# Patient Record
Sex: Male | Born: 1970 | Race: White | Hispanic: No | Marital: Married | Smoking: Never smoker
Health system: Southern US, Community
[De-identification: ages and names within clinical notes are randomized; demographics above are authoritative.]

## PROBLEM LIST (undated history)

## (undated) DIAGNOSIS — Z87442 Personal history of urinary calculi: Secondary | ICD-10-CM

## (undated) DIAGNOSIS — K219 Gastro-esophageal reflux disease without esophagitis: Secondary | ICD-10-CM

## (undated) DIAGNOSIS — F32A Depression, unspecified: Secondary | ICD-10-CM

## (undated) DIAGNOSIS — M1990 Unspecified osteoarthritis, unspecified site: Secondary | ICD-10-CM

## (undated) DIAGNOSIS — N189 Chronic kidney disease, unspecified: Secondary | ICD-10-CM

## (undated) DIAGNOSIS — G4730 Sleep apnea, unspecified: Secondary | ICD-10-CM

## (undated) HISTORY — PX: SHOULDER ARTHROSCOPY W/ ROTATOR CUFF REPAIR: SHX2400

## (undated) HISTORY — PX: BICEPS TENDON REPAIR: SHX566

---

## 2010-12-08 DIAGNOSIS — J309 Allergic rhinitis, unspecified: Secondary | ICD-10-CM | POA: Insufficient documentation

## 2011-06-03 ENCOUNTER — Other Ambulatory Visit: Payer: Self-pay | Admitting: Family Medicine

## 2011-06-03 ENCOUNTER — Ambulatory Visit
Admission: RE | Admit: 2011-06-03 | Discharge: 2011-06-03 | Disposition: A | Discharge status: Home / Self Care | Payer: BC Managed Care – PPO | Source: Ambulatory Visit | Attending: Family Medicine | Admitting: Family Medicine

## 2011-06-03 DIAGNOSIS — R109 Unspecified abdominal pain: Secondary | ICD-10-CM

## 2013-08-24 DIAGNOSIS — M1610 Unilateral primary osteoarthritis, unspecified hip: Secondary | ICD-10-CM | POA: Insufficient documentation

## 2014-07-29 DIAGNOSIS — R7989 Other specified abnormal findings of blood chemistry: Secondary | ICD-10-CM | POA: Insufficient documentation

## 2014-08-06 ENCOUNTER — Ambulatory Visit: Payer: Self-pay | Admitting: Sports Medicine

## 2015-01-07 ENCOUNTER — Encounter: Payer: Self-pay | Admitting: Sports Medicine

## 2015-01-07 ENCOUNTER — Ambulatory Visit (INDEPENDENT_AMBULATORY_CARE_PROVIDER_SITE_OTHER): Payer: BLUE CROSS/BLUE SHIELD | Admitting: Sports Medicine

## 2015-01-07 VITALS — BP 132/81 | HR 88 | Ht 71.0 in | Wt 251.0 lb

## 2015-01-07 DIAGNOSIS — E669 Obesity, unspecified: Secondary | ICD-10-CM

## 2015-01-07 DIAGNOSIS — M1612 Unilateral primary osteoarthritis, left hip: Secondary | ICD-10-CM

## 2015-01-07 DIAGNOSIS — Z299 Encounter for prophylactic measures, unspecified: Secondary | ICD-10-CM

## 2015-01-07 DIAGNOSIS — F329 Major depressive disorder, single episode, unspecified: Secondary | ICD-10-CM

## 2015-01-07 DIAGNOSIS — F32A Depression, unspecified: Secondary | ICD-10-CM | POA: Insufficient documentation

## 2015-01-07 MED ORDER — PHENTERMINE HCL 37.5 MG PO TABS: ORAL_TABLET | ORAL | Status: DC

## 2015-01-07 MED ORDER — CELECOXIB 200 MG PO CAPS: ORAL_CAPSULE | ORAL | Status: DC

## 2015-01-07 NOTE — Assessment & Plan Note (Signed)
Starting phentermine, exercise prescription given, nutrition referral, return monthly for weight checks and refills.

## 2015-01-07 NOTE — Assessment & Plan Note (Addendum)
We will keep this on the back burner for now. Rechecking a PHQ9 in 3 months.

## 2015-01-07 NOTE — Assessment & Plan Note (Signed)
Checking routine bloodwork. 

## 2015-01-07 NOTE — Progress Notes (Signed)
  Subjective:    CC: Establish care.   HPI:  Brady Mccormick is here to establish care, he has multiple issues.  Left hip pain: Diagnosed primary hip osteoarthritis, has never had injections. Meloxicam leaves him with a bit of dry mouth, he is agreeable to try different NSAID. At  Obesity: Desires to start weight loss medication  Mood disorder: Son has cystic fibrosis, has had multiple heart surgeries, he does endorse severe fatigue, moderate guilt, mild anhedonia, depressed mood, difficulty staying asleep, overeating, difficulty concentrating and denies any suicidal or homicidal ideation, he also has severe difficult relaxing, moderate inability controlling his worry, worrying about different things, irritability, and mild nervousness as well as restlessness.  Past medical history, Surgical history, Family history not pertinant except as noted below, Social history, Allergies, and medications have been entered into the medical record, reviewed, and no changes needed.   Review of Systems: No headache, visual changes, nausea, vomiting, diarrhea, constipation, dizziness, abdominal pain, skin rash, fevers, chills, night sweats, swollen lymph nodes, weight loss, chest pain, body aches, joint swelling, muscle aches, shortness of breath, mood changes, visual or auditory hallucinations.  Objective:    General: Well Developed, well nourished, and in no acute distress.  Neuro: Alert and oriented x3, extra-ocular muscles intact, sensation grossly intact.  HEENT: Normocephalic, atraumatic, pupils equal round reactive to light, neck supple, no masses, no lymphadenopathy, thyroid nonpalpable.  Skin: Warm and dry, no rashes noted.  Cardiac: Regular rate and rhythm, no murmurs rubs or gallops.  Respiratory: Clear to auscultation bilaterally. Not using accessory muscles, speaking in full sentences.  Abdominal: Soft, nontender, nondistended, positive bowel sounds, no masses, no organomegaly.  Musculoskeletal:  Shoulder, elbow, wrist, hip, knee, ankle stable, and with full range of motion  Impression and Recommendations:    The patient was counselled, risk factors were discussed, anticipatory guidance given.

## 2015-01-07 NOTE — Assessment & Plan Note (Signed)
Switching to Celebrex, return for custom orthotics, right hip injection if no better.

## 2015-01-08 LAB — CBC
HCT: 45.4 % (ref 39.0–52.0)
Hemoglobin: 16.6 g/dL (ref 13.0–17.0)
MCH: 31.7 pg (ref 26.0–34.0)
MCHC: 36.6 g/dL — ABNORMAL HIGH (ref 30.0–36.0)
MCV: 86.6 fL (ref 78.0–100.0)
MPV: 9.9 fL (ref 8.6–12.4)
Platelets: 248 10*3/uL (ref 150–400)
RBC: 5.24 MIL/uL (ref 4.22–5.81)
RDW: 13.1 % (ref 11.5–15.5)
WBC: 5 K/uL (ref 4.0–10.5)

## 2015-01-08 LAB — COMPREHENSIVE METABOLIC PANEL
ALT: 32 U/L (ref 9–46)
Albumin: 4.3 g/dL (ref 3.6–5.1)
Alkaline Phosphatase: 63 U/L (ref 40–115)
BUN: 15 mg/dL (ref 7–25)
Calcium: 9.9 mg/dL (ref 8.6–10.3)
Chloride: 100 mmol/L (ref 98–110)
Potassium: 4.8 mmol/L (ref 3.5–5.3)
Total Protein: 7.1 g/dL (ref 6.1–8.1)

## 2015-01-08 LAB — LIPID PANEL
Cholesterol: 230 mg/dL — ABNORMAL HIGH (ref 125–200)
HDL: 44 mg/dL (ref >=40)
LDL Cholesterol: 159 mg/dL — ABNORMAL HIGH (ref <130)
Total CHOL/HDL Ratio: 5.2 Ratio — ABNORMAL HIGH (ref <=5.0)
Triglycerides: 133 mg/dL (ref <150)
VLDL: 27 mg/dL (ref <30)

## 2015-01-08 LAB — COMPREHENSIVE METABOLIC PANEL WITH GFR
AST: 24 U/L (ref 10–40)
CO2: 30 mmol/L (ref 20–31)
Creat: 0.97 mg/dL (ref 0.60–1.35)
Glucose, Bld: 102 mg/dL — ABNORMAL HIGH (ref 65–99)
Sodium: 140 mmol/L (ref 135–146)
Total Bilirubin: 0.6 mg/dL (ref 0.2–1.2)

## 2015-01-08 LAB — TSH: TSH: 1.44 u[IU]/mL (ref 0.350–4.500)

## 2015-01-09 LAB — TESTOSTERONE, FREE, TOTAL, SHBG
Sex Hormone Binding: 39 nmol/L (ref 10–50)
Testosterone, Free: 101.3 pg/mL (ref 47.0–244.0)
Testosterone-% Free: 1.9 % (ref 1.6–2.9)
Testosterone: 530 ng/dL (ref 300–890)

## 2015-01-09 LAB — VITAMIN D 25 HYDROXY (VIT D DEFICIENCY, FRACTURES): Vit D, 25-Hydroxy: 35 ng/mL (ref 30–100)

## 2015-01-09 LAB — HEMOGLOBIN A1C
Hgb A1c MFr Bld: 5.6 % (ref <5.7)
Mean Plasma Glucose: 114 mg/dL (ref <117)

## 2015-01-14 ENCOUNTER — Telehealth: Payer: Self-pay | Admitting: Sports Medicine

## 2015-01-14 NOTE — Telephone Encounter (Signed)
Received fax for prior authorization on Celecoxib sent through cover my meds waiting on authorization. - CF °

## 2015-01-23 NOTE — Telephone Encounter (Signed)
[facility] [place] [facility].  Will give information to PCP for review.

## 2015-01-30 ENCOUNTER — Encounter: Payer: Self-pay | Admitting: Sports Medicine

## 2015-01-30 ENCOUNTER — Ambulatory Visit (INDEPENDENT_AMBULATORY_CARE_PROVIDER_SITE_OTHER): Payer: BLUE CROSS/BLUE SHIELD | Admitting: Sports Medicine

## 2015-01-30 VITALS — BP 143/81 | HR 93 | Ht 71.0 in | Wt 247.0 lb

## 2015-01-30 DIAGNOSIS — F32A Depression, unspecified: Secondary | ICD-10-CM

## 2015-01-30 DIAGNOSIS — M1612 Unilateral primary osteoarthritis, left hip: Secondary | ICD-10-CM

## 2015-01-30 DIAGNOSIS — E669 Obesity, unspecified: Secondary | ICD-10-CM | POA: Diagnosis not present

## 2015-01-30 DIAGNOSIS — F329 Major depressive disorder, single episode, unspecified: Secondary | ICD-10-CM

## 2015-01-30 MED ORDER — PHENTERMINE HCL 37.5 MG PO TABS: ORAL_TABLET | ORAL | Status: DC

## 2015-01-30 MED ORDER — ETODOLAC ER 600 MG PO TB24: 600.0000 mg | ORAL_TABLET | Freq: Every day | ORAL | Status: DC

## 2015-01-30 NOTE — Assessment & Plan Note (Signed)
4 pound weight loss after 3 weeks on phentermine, does have an appointment coming up with nutrition. Refilling medication and return in one month.

## 2015-01-30 NOTE — Assessment & Plan Note (Signed)
We will recheck a PHQ9 at the next visit and if still elevated will consider serotonin and norepinephrine reuptake inhibitor.

## 2015-01-30 NOTE — Progress Notes (Signed)

## 2015-01-30 NOTE — Assessment & Plan Note (Signed)
Custom orthotics as above, switching to [place].

## 2015-02-03 ENCOUNTER — Ambulatory Visit (INDEPENDENT_AMBULATORY_CARE_PROVIDER_SITE_OTHER): Payer: BLUE CROSS/BLUE SHIELD | Admitting: Sports Medicine

## 2015-02-03 ENCOUNTER — Encounter: Payer: Self-pay | Admitting: Sports Medicine

## 2015-02-03 VITALS — BP 138/85 | HR 90 | Temp 97.9°F | Wt 245.0 lb

## 2015-02-03 DIAGNOSIS — J029 Acute pharyngitis, unspecified: Secondary | ICD-10-CM | POA: Diagnosis not present

## 2015-02-03 DIAGNOSIS — J209 Acute bronchitis, unspecified: Secondary | ICD-10-CM | POA: Insufficient documentation

## 2015-02-03 LAB — POCT RAPID STREP A (OFFICE): Rapid Strep A Screen: NEGATIVE

## 2015-02-03 MED ORDER — BENZONATATE 200 MG PO CAPS: 200.0000 mg | ORAL_CAPSULE | Freq: Three times a day (TID) | ORAL | Status: DC | PRN

## 2015-02-03 MED ORDER — AZITHROMYCIN 250 MG PO TABS: ORAL_TABLET | ORAL | Status: DC

## 2015-02-03 NOTE — Assessment & Plan Note (Addendum)
Adding Jerilynn Somessalon Perles, his son has cystic fibrosis so we will be very careful with bacterial pathogens so I am going to start azithromycin. Otherwise exam is negative.

## 2015-02-03 NOTE — Progress Notes (Signed)
  Subjective:    CC: "throat has been sore"  HPI: Patient presents with a 3d history of throat pain and cough productive with "greenish" sputum. He has not taken his temperature at home. The patient has had a runny nose but has had no trouble breathing. No headache, general muscle aches, frontal fullness, ear pain, ear ringing, or visual changes. 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 Of note, he has a son at home with CF and he is worried about getting him sick.  Past medical history, Surgical history, Family history not pertinant except as noted below, Social history, Allergies, and medications have been entered into the medical record, reviewed, and no changes needed.   Review of Systems: No fevers, chills, night sweats, weight loss, chest pain, or shortness of breath.   Objective:    General: Well Developed, well nourished, and in no acute distress.  Neuro: Alert and oriented x3, extra-ocular muscles intact, sensation grossly intact.  HEENT: Normocephalic, atraumatic, oropharynx erythematous without tonsillar exudates, nasal crusting, no tenderness with frontal or ethmoid pressure, TM grey with no bulging bilaterally, pupils equal round reactive to light, neck supple, no masses, no lymphadenopathy, thyroid nonpalpable.  Skin: Warm and dry, no rashes. Cardiac: Regular rate and rhythm, no murmurs rubs or gallops, no lower extremity edema.  Respiratory: Clear to auscultation bilaterally. Not using accessory muscles, speaking in full sentences.   Impression and Recommendations:    Patient most likely has a viral bronchitis given his productive cough. Rapid strep was negative in the office. Given his home situation, son with CF, and time spent in hospitals, we will treat with a 5d course of azithromycin and tessalon pearls for his cough. He was instructed to use precautions around his son as viral infections can have more severe effects on these patients.

## 2015-02-03 NOTE — Patient Instructions (Signed)
Acute Bronchitis Bronchitis is inflammation of the airways that extend from the windpipe into the lungs (bronchi). The inflammation often causes mucus to develop. This leads to a cough, which is the most common symptom of bronchitis.  In acute bronchitis, the condition usually develops suddenly and goes away over time, usually in a couple weeks. Smoking, allergies, and asthma can make bronchitis worse. Repeated episodes of bronchitis may cause further lung problems.  CAUSES Acute bronchitis is most often caused by the same virus that causes a cold. The virus can spread from person to person (contagious) through coughing, sneezing, and touching contaminated objects. SIGNS AND SYMPTOMS   Cough.   Fever.   Coughing up mucus.   Body aches.   Chest congestion.   Chills.   Shortness of breath.   Sore throat.  DIAGNOSIS  Acute bronchitis is usually diagnosed through a physical exam. Your health care provider will also ask you questions about your medical history. Tests, such as chest X-rays, are sometimes done to rule out other conditions.  TREATMENT  Acute bronchitis usually goes away in a couple weeks. Oftentimes, no medical treatment is necessary. Medicines are sometimes given for relief of fever or cough. Antibiotic medicines are usually not needed but may be prescribed in certain situations. In some cases, an inhaler may be recommended to help reduce shortness of breath and control the cough. A cool mist vaporizer may also be used to help thin bronchial secretions and make it easier to clear the chest.  HOME CARE INSTRUCTIONS  Get plenty of rest.   Drink enough fluids to keep your urine clear or pale yellow (unless you have a medical condition that requires fluid restriction). Increasing fluids may help thin your respiratory secretions (sputum) and reduce chest congestion, and it will prevent dehydration.   Take medicines only as directed by your health care provider.  If  you were prescribed an antibiotic medicine, finish it all even if you start to feel better.  Avoid smoking and secondhand smoke. Exposure to cigarette smoke or irritating chemicals will make bronchitis worse. If you are a smoker, consider using nicotine gum or skin patches to help control withdrawal symptoms. Quitting smoking will help your lungs heal faster.   Reduce the chances of another bout of acute bronchitis by washing your hands frequently, avoiding people with cold symptoms, and trying not to touch your hands to your mouth, nose, or eyes.   Keep all follow-up visits as directed by your health care provider.  SEEK MEDICAL CARE IF: Your symptoms do not improve after 1 week of treatment.  SEEK IMMEDIATE MEDICAL CARE IF:  You develop an increased fever or chills.   You have chest pain.   You have severe shortness of breath.  You have bloody sputum.   You develop dehydration.  You faint or repeatedly feel like you are going to pass out.  You develop repeated vomiting.  You develop a severe headache. MAKE SURE YOU:   Understand these instructions.  Will watch your condition.  Will get help right away if you are not doing well or get worse.   This information is not intended to replace advice given to you by your health care provider. Make sure you discuss any questions you have with your health care provider.   Document Released: 04/08/2004 Document Revised: 03/22/2014 Document Reviewed: 08/22/2012 Elsevier Interactive Patient Education 2016 Else[employer] Inc.  

## 2015-02-10 ENCOUNTER — Telehealth: Payer: Self-pay

## 2015-02-10 NOTE — Telephone Encounter (Signed)
This is because he likely has a viral process which we discussed at the last visit,there is no antibiotic for a virus, a single course of azithromycin will eliminate all of the bacteria. Ultimately he needs to keep his mouth covered with a mass, keep his hands cleaned with alcohol hand cleaner, and avoid sharing utensils. Get over-the-counter DayQuil and NyQuil to decrease symptoms.

## 2015-02-10 NOTE — Telephone Encounter (Signed)
Left a detailed message advising of recommendations.

## 2015-02-10 NOTE — Telephone Encounter (Signed)
Brady Mccormick called and states he is not better. Still has congestion and sore throat. Denies fever, chills or sweats. He is calling for another Zpack. Please advise.

## 2015-03-18 ENCOUNTER — Ambulatory Visit: Payer: BLUE CROSS/BLUE SHIELD | Admitting: Sports Medicine

## 2015-05-29 DIAGNOSIS — F5101 Primary insomnia: Secondary | ICD-10-CM | POA: Insufficient documentation

## 2015-07-22 ENCOUNTER — Encounter: Payer: Self-pay | Admitting: Sports Medicine

## 2015-07-22 ENCOUNTER — Ambulatory Visit (INDEPENDENT_AMBULATORY_CARE_PROVIDER_SITE_OTHER): Payer: BLUE CROSS/BLUE SHIELD | Admitting: Sports Medicine

## 2015-07-22 VITALS — BP 125/76 | HR 87 | Resp 18 | Wt 257.0 lb

## 2015-07-22 DIAGNOSIS — K5904 Chronic idiopathic constipation: Secondary | ICD-10-CM | POA: Diagnosis not present

## 2015-07-22 DIAGNOSIS — N200 Calculus of kidney: Secondary | ICD-10-CM

## 2015-07-22 DIAGNOSIS — E669 Obesity, unspecified: Secondary | ICD-10-CM

## 2015-07-22 MED ORDER — LINACLOTIDE 145 MCG PO CAPS: 145.0000 ug | ORAL_CAPSULE | Freq: Every day | ORAL | Status: DC

## 2015-07-22 MED ORDER — PHENTERMINE HCL 37.5 MG PO TABS: ORAL_TABLET | ORAL | Status: DC

## 2015-07-22 NOTE — Assessment & Plan Note (Signed)
Restarting phentermine. Return monthly for weight checks and refills.  

## 2015-07-22 NOTE — Assessment & Plan Note (Addendum)
Orders placed for stone analysis, he does have a stone that he is saved from previous passing. If calcium oxalate we can consider urinary alkalinization.

## 2015-07-22 NOTE — Progress Notes (Signed)
  Subjective:    CC: follow-up  HPI: Obesity: Desires to restart weight loss treatment.  Nephrolithiasis: Has a stone that he saved, has never had a stone analysis.  Constipation and bloating: Present for years, minimal pain. Initially thought to be food allergies but has never had diarrhea, nausea, vomiting to any of the foods that he tested positive for.  Past medical history, Surgical history, Family history not pertinant except as noted below, Social history, Allergies, and medications have been entered into the medical record, reviewed, and no changes needed.   Review of Systems: No fevers, chills, night sweats, weight loss, chest pain, or shortness of breath.   Objective:    General: Well Developed, well nourished, and in no acute distress.  Neuro: Alert and oriented x3, extra-ocular muscles intact, sensation grossly intact.  HEENT: Normocephalic, atraumatic, pupils equal round reactive to light, neck supple, no masses, no lymphadenopathy, thyroid nonpalpable.  Skin: Warm and dry, no rashes. Cardiac: Regular rate and rhythm, no murmurs rubs or gallops, no lower extremity edema.  Respiratory: Clear to auscultation bilaterally. Not using accessory muscles, speaking in full sentences.  Impression and Recommendations:    I spent 25 minutes with this patient, greater than 50% was face-to-face time counseling regarding the above diagnoses

## 2015-07-22 NOTE — Assessment & Plan Note (Signed)
Starting Linzess 

## 2015-08-19 ENCOUNTER — Encounter: Payer: Self-pay | Admitting: Sports Medicine

## 2015-08-19 ENCOUNTER — Ambulatory Visit (INDEPENDENT_AMBULATORY_CARE_PROVIDER_SITE_OTHER): Payer: BLUE CROSS/BLUE SHIELD | Admitting: Sports Medicine

## 2015-08-19 DIAGNOSIS — K5904 Chronic idiopathic constipation: Secondary | ICD-10-CM | POA: Diagnosis not present

## 2015-08-19 DIAGNOSIS — E669 Obesity, unspecified: Secondary | ICD-10-CM

## 2015-08-19 MED ORDER — LUBIPROSTONE 8 MCG PO CAPS: 8.0000 ug | ORAL_CAPSULE | Freq: Two times a day (BID) | ORAL | Status: DC

## 2015-08-19 MED ORDER — BUPROPION HCL ER (XL) 150 MG PO TB24: ORAL_TABLET | ORAL | Status: DC

## 2015-08-19 MED ORDER — NALTREXONE HCL 50 MG PO TABS: ORAL_TABLET | ORAL | Status: DC

## 2015-08-19 NOTE — Assessment & Plan Note (Signed)
Starting Wellbutrin and naltrexone as separate generics for weight loss. Return in one month, discontinue phentermine

## 2015-08-19 NOTE — Progress Notes (Addendum)
  Subjective:    CC: Follow-up  HPI: Obesity: No weight loss with phentermine. Agreeable to switch to something else.  Depression: With moderate symptoms, would like to try the Wellbutrin/naltrexone combo.  Chronic idiopathic constipation: Unable to afford Linzess and samples did not provide relief, has already tried polyethylene glycol/MiraLAX without sufficient results for months, agreeable to try Amitiza.  Past medical history, Surgical history, Family history not pertinant except as noted below, Social history, Allergies, and medications have been entered into the medical record, reviewed, and no changes needed.   Review of Systems: No fevers, chills, night sweats, weight loss, chest pain, or shortness of breath.   Objective:    General: Well Developed, well nourished, and in no acute distress.  Neuro: Alert and oriented x3, extra-ocular muscles intact, sensation grossly intact.  HEENT: Normocephalic, atraumatic, pupils equal round reactive to light, neck supple, no masses, no lymphadenopathy, thyroid nonpalpable.  Skin: Warm and dry, no rashes. Cardiac: Regular rate and rhythm, no murmurs rubs or gallops, no lower extremity edema.  Respiratory: Clear to auscultation bilaterally. Not using accessory muscles, speaking in full sentences.  Impression and Recommendations:

## 2015-08-19 NOTE — Assessment & Plan Note (Addendum)
SUnable to afford Linzess and samples did not provide relief, has already tried polyethylene glycol/MiraLAX without sufficient results for months, agreeable to try Amitiza.

## 2015-08-20 ENCOUNTER — Telehealth: Payer: Self-pay | Admitting: *Deleted

## 2015-08-20 NOTE — Telephone Encounter (Signed)
Initiated PA for [employer] through [employer]

## 2015-08-25 NOTE — Telephone Encounter (Signed)
Denied.letter placed in providers box 

## 2015-08-27 ENCOUNTER — Telehealth: Payer: Self-pay | Admitting: *Deleted

## 2015-08-27 NOTE — Telephone Encounter (Signed)
Will appeal the decision to deny the amitiza due to additional information

## 2015-09-15 ENCOUNTER — Ambulatory Visit: Payer: BLUE CROSS/BLUE SHIELD | Admitting: Sports Medicine

## 2015-10-30 ENCOUNTER — Telehealth: Payer: Self-pay | Admitting: *Deleted

## 2015-10-30 NOTE — Telephone Encounter (Signed)
closed

## 2015-10-30 NOTE — Telephone Encounter (Signed)
Checked on status of this and the appeal had already been approved

## 2015-11-13 ENCOUNTER — Encounter: Payer: Self-pay | Admitting: Sports Medicine

## 2015-11-13 ENCOUNTER — Ambulatory Visit (INDEPENDENT_AMBULATORY_CARE_PROVIDER_SITE_OTHER): Payer: BLUE CROSS/BLUE SHIELD | Admitting: Sports Medicine

## 2015-11-13 DIAGNOSIS — E669 Obesity, unspecified: Secondary | ICD-10-CM

## 2015-11-13 DIAGNOSIS — F32A Depression, unspecified: Secondary | ICD-10-CM

## 2015-11-13 DIAGNOSIS — K5904 Chronic idiopathic constipation: Secondary | ICD-10-CM | POA: Diagnosis not present

## 2015-11-13 DIAGNOSIS — F329 Major depressive disorder, single episode, unspecified: Secondary | ICD-10-CM | POA: Diagnosis not present

## 2015-11-13 MED ORDER — LORCASERIN HCL ER 20 MG PO TB24: 1.0000 | ORAL_TABLET | Freq: Every day | ORAL | 0 refills | Status: DC

## 2015-11-13 MED ORDER — ZOLPIDEM TARTRATE 10 MG PO TABS: ORAL_TABLET | ORAL | 5 refills | Status: DC

## 2015-11-13 NOTE — Assessment & Plan Note (Signed)
Improved with a natural supplement, Amitiza was still too expensive.

## 2015-11-13 NOTE — Assessment & Plan Note (Signed)
No weight loss with phentermine, unable to tolerate Contrave generic. Discontinue all of the above and [facility]

## 2015-11-13 NOTE — Assessment & Plan Note (Signed)
Adding Wellbutrin, discontinue naltrexone

## 2015-11-13 NOTE — Progress Notes (Signed)
  Subjective:    CC: Follow-up  HPI: Depression: Did not continue with Wellbutrin, still has severe fatigue, moderate anhedonia, overeating, and mild depressed mood, guilt, difficulty concentrating, no suicidal or homicidal ideation, also with moderate difficulty relaxing and mild worrying and irritability.  Obesity: Did lose any weight on phentermine, was unable to tolerate Contrave for more than one week.  Constipation: Resolved with a natural supplement of some type.  Insomnia: Needs a refill on Ambien.  Past medical history, Surgical history, Family history not pertinant except as noted below, Social history, Allergies, and medications have been entered into the medical record, reviewed, and no changes needed.   Review of Systems: No fevers, chills, night sweats, weight loss, chest pain, or shortness of breath.   Objective:    General: Well Developed, well nourished, and in no acute distress.  Neuro: Alert and oriented x3, extra-ocular muscles intact, sensation grossly intact.  HEENT: Normocephalic, atraumatic, pupils equal round reactive to light, neck supple, no masses, no lymphadenopathy, thyroid nonpalpable.  Skin: Warm and dry, no rashes. Cardiac: Regular rate and rhythm, no murmurs rubs or gallops, no lower extremity edema.  Respiratory: Clear to auscultation bilaterally. Not using accessory muscles, speaking in full sentences.  Impression and Recommendations:    Depression Adding Wellbutrin, discontinue naltrexone  Obesity No weight loss with phentermine, unable to tolerate Contrave generic. Discontinue all of the above and switching to Belviq  Chronic idiopathic constipation Improved with a natural supplement, Amitiza was still too expensive.  I spent 25 minutes with this patient, greater than 50% was face-to-face time counseling regarding the above diagnoses

## 2015-12-16 ENCOUNTER — Ambulatory Visit (INDEPENDENT_AMBULATORY_CARE_PROVIDER_SITE_OTHER): Payer: BLUE CROSS/BLUE SHIELD | Admitting: Sports Medicine

## 2015-12-16 ENCOUNTER — Ambulatory Visit: Payer: BLUE CROSS/BLUE SHIELD | Admitting: Sports Medicine

## 2015-12-16 ENCOUNTER — Encounter: Payer: Self-pay | Admitting: Sports Medicine

## 2015-12-16 DIAGNOSIS — F322 Major depressive disorder, single episode, severe without psychotic features: Secondary | ICD-10-CM

## 2015-12-16 DIAGNOSIS — M19041 Primary osteoarthritis, right hand: Secondary | ICD-10-CM | POA: Diagnosis not present

## 2015-12-16 DIAGNOSIS — M5030 Other cervical disc degeneration, unspecified cervical region: Secondary | ICD-10-CM | POA: Insufficient documentation

## 2015-12-16 DIAGNOSIS — M19042 Primary osteoarthritis, left hand: Secondary | ICD-10-CM

## 2015-12-16 DIAGNOSIS — E6609 Other obesity due to excess calories: Secondary | ICD-10-CM | POA: Diagnosis not present

## 2015-12-16 MED ORDER — CELECOXIB 200 MG PO CAPS: ORAL_CAPSULE | ORAL | 2 refills | Status: DC

## 2015-12-16 MED ORDER — LORCASERIN HCL ER 20 MG PO TB24: 1.0000 | ORAL_TABLET | Freq: Every day | ORAL | 3 refills | Status: DC

## 2015-12-16 MED ORDER — PHENTERMINE HCL 37.5 MG PO TABS: ORAL_TABLET | ORAL | 0 refills | Status: DC

## 2015-12-16 MED ORDER — PREDNISONE 50 MG PO TABS: ORAL_TABLET | ORAL | 0 refills | Status: DC

## 2015-12-16 NOTE — Assessment & Plan Note (Signed)
X-rays, prednisone, formal physical therapy. Symptoms are bilateral C7 suggesting a central disc protrusion.

## 2015-12-16 NOTE — Assessment & Plan Note (Signed)
With pain predominantly at the proximal interphalangeal joints. Consistent with osteoarthritis,

## 2015-12-16 NOTE — Assessment & Plan Note (Signed)
Did not lose any weight on Belviq after the first month. I'm going to give him another chance on phentermine. Continue Belviq, return in one month.

## 2015-12-16 NOTE — Progress Notes (Signed)
Subjective:    CC: depression follow-up  HPI:   Depression: Currently well-controlled, PHQ-9 of 7, down from 10 one month ago.  He says he is happy with his mood and denies anhedonia or depression.  He is currently on Wellbutrin, but says he doesn't notice much of a difference in his mood since last visit.   Obesity: Belviq has not helped decrease his appetite.  He recently re-started a workout regimen and nutrition program, and is motivated to lose weight.  He is interested in re-starting phentermine because it worked in the past at decreasing his appetite and reducing fatigue.  He discontinued phentermine in the past due to constipation, but he has found a new herb that has helped him with constipation and he would like to try supplementing that to see if it will allow him to tolerate phentermine.  He believes most of his fatigue and MSK complaints are due to excess weight, and thinks that by decreasing his weight he will be able to improve how he feels overall.   Neck pain and arm numbness: Complains of 2 years of lower neck pain, described as "constant throbbing", especially in the middle of the night and the morning.  He was seen by a "cervical doctor that does not accept insurance" who "took imaging scans of me every appointment and performed cervical manipulation".  This originally helped his symptoms, but has not been working the past couple of months.  He has stopped seeing this practitioner.  He is also complaining of painful numbness that travels from his shoulder down to his fingertips bilaterally.  He says it is intermittent but has become more frequent and painful over the past month.  He says it feels like his arms are falling asleep.  It occurs most commonly at night, but also occurs randomly throughout the day.  He said it affects his whole arm, forearm, and all digits of his hand.  Ibuprofen does not help with the pain.    Past medical history:  Negative.  See flowsheet/record as well  for more information.  Surgical history: Negative.  See flowsheet/record as well for more information.  Family history: Negative.  See flowsheet/record as well for more information.  Social history: Negative.  See flowsheet/record as well for more information.  Allergies, and medications have been entered into the medical record, reviewed, and no changes needed.   Review of Systems: No fevers, chills, night sweats, weight loss, chest pain, or shortness of breath.   Objective:    General: Well Developed, well nourished, and in no acute distress.  Neuro: Alert and oriented x3, extra-ocular muscles intact, sensation grossly intact.  HEENT: Normocephalic, atraumatic, pupils equal round reactive to light, neck supple, no masses, no lymphadenopathy, thyroid nonpalpable.  Skin: Warm and dry, no rashes. Cardiac: Regular rate and rhythm, no murmurs rubs or gallops, no lower extremity edema.  Respiratory: Clear to auscultation bilaterally. Not using accessory muscles, speaking in full sentences. Back Exam:  Inspection: Unremarkable  Motion: Flexion 45 deg, Extension 45 deg, Side Bending to 45 deg bilaterally,  Rotation to 45 deg bilaterally  SLR laying: Negative  XSLR laying: Negative  Palpable tenderness: None. FABER: negative. Sensory change: Gross sensation intact to all lumbar and sacral dermatomes.  Reflexes: 2+ at both patellar tendons, 2+ at achilles tendons, Babinski's downgoing.  Strength at foot  Plantar-flexion: 5/5 Dorsi-flexion: 5/5 Eversion: 5/5 Inversion: 5/5  Leg strength  Quad: 5/5 Hamstring: 5/5 Hip flexor: 5/5 Hip abductors: 5/5  Gait unremarkable.  Impression and Recommendations:    1. Depression: PHQ-9 of 7, down from 10 last month.  Currently on Wellbutrin.  He is happy with his mood.  -Continue Wellbutrin   2. Obesity: Has not noticed much of a difference on Belviq.  He recently re-started an exercise and nutrition program.  -restart phentermine  -continue  Belviq  -return in 1 month for weight re-check   3. 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

## 2015-12-16 NOTE — Assessment & Plan Note (Signed)
Doing well, no changes 

## 2015-12-18 ENCOUNTER — Telehealth: Payer: Self-pay | Admitting: *Deleted

## 2015-12-18 NOTE — Telephone Encounter (Signed)
[id number]

## 2015-12-22 NOTE — Telephone Encounter (Signed)
Approvedon October 5  Effective from 12/18/2015 through 06/14/2016. initial

## 2015-12-22 NOTE — Telephone Encounter (Signed)
belviq approved through insurance. Pt notified and pharm notified

## 2016-01-19 ENCOUNTER — Ambulatory Visit: Payer: BLUE CROSS/BLUE SHIELD | Admitting: Sports Medicine

## 2016-01-20 ENCOUNTER — Encounter: Payer: Self-pay | Admitting: Sports Medicine

## 2016-01-20 ENCOUNTER — Ambulatory Visit (INDEPENDENT_AMBULATORY_CARE_PROVIDER_SITE_OTHER): Payer: BLUE CROSS/BLUE SHIELD

## 2016-01-20 ENCOUNTER — Ambulatory Visit (INDEPENDENT_AMBULATORY_CARE_PROVIDER_SITE_OTHER): Payer: BLUE CROSS/BLUE SHIELD | Admitting: Sports Medicine

## 2016-01-20 DIAGNOSIS — R05 Cough: Secondary | ICD-10-CM | POA: Diagnosis not present

## 2016-01-20 DIAGNOSIS — M5030 Other cervical disc degeneration, unspecified cervical region: Secondary | ICD-10-CM | POA: Diagnosis not present

## 2016-01-20 DIAGNOSIS — M19041 Primary osteoarthritis, right hand: Secondary | ICD-10-CM

## 2016-01-20 DIAGNOSIS — J209 Acute bronchitis, unspecified: Secondary | ICD-10-CM | POA: Insufficient documentation

## 2016-01-20 DIAGNOSIS — M19042 Primary osteoarthritis, left hand: Secondary | ICD-10-CM

## 2016-01-20 DIAGNOSIS — E6609 Other obesity due to excess calories: Secondary | ICD-10-CM

## 2016-01-20 MED ORDER — PHENTERMINE HCL 37.5 MG PO TABS: ORAL_TABLET | ORAL | 0 refills | Status: DC

## 2016-01-20 MED ORDER — BENZONATATE 200 MG PO CAPS: 200.0000 mg | ORAL_CAPSULE | Freq: Three times a day (TID) | ORAL | 0 refills | Status: DC | PRN

## 2016-01-20 NOTE — Assessment & Plan Note (Signed)
Chest x-ray, continue Flonase, adding Tessalon Perles. Return if no better in 2 weeks.

## 2016-01-20 NOTE — Assessment & Plan Note (Signed)
Good improvements with Celebrex.

## 2016-01-20 NOTE — Progress Notes (Signed)
  Subjective:    CC: Follow-up  HPI: Obesity: Good weight loss after the first month on phentermine, was unable to afford Belviq for any more doses.  Depression: Has come off of Wellbutrin, no suicidal or homicidal ideation, does not desire to continue.  Bilateral hand osteoarthritis: Improved significantly with Celebrex.  Bilateral C7 radiculopathy: Improved with Celebrex, has his first session of physical therapy coming up.  Cough: Mild, persistent, present only for a few days, no constitutional symptoms, short of breath, cough is mainly productive but no hemoptysis. No GI symptoms, no rash.  Past medical history:  Negative.  See flowsheet/record as well for more information.  Surgical history: Negative.  See flowsheet/record as well for more information.  Family history: Negative.  See flowsheet/record as well for more information.  Social history: Negative.  See flowsheet/record as well for more information.  Allergies, and medications have been entered into the medical record, reviewed, and no changes needed.   Review of Systems: No fevers, chills, night sweats, weight loss, chest pain, or shortness of breath.   Objective:    General: Well Developed, well nourished, and in no acute distress.  Neuro: Alert and oriented x3, extra-ocular muscles intact, sensation grossly intact.  HEENT: Normocephalic, atraumatic, pupils equal round reactive to light, neck supple, no masses, no lymphadenopathy, thyroid nonpalpable.  Skin: Warm and dry, no rashes. Cardiac: Regular rate and rhythm, no murmurs rubs or gallops, no lower extremity edema.  Respiratory: Clear to auscultation bilaterally. Not using accessory muscles, speaking in full sentences.  Impression and Recommendations:    Obesity Unable to afford Belviq with discount coupon. Refilling phentermine. Return in one month, entering the second month.  Primary osteoarthritis of both hands Good improvements with Celebrex.  DDD  (degenerative disc disease), cervical Bilateral C7 symptoms, patient will be starting physical therapy next week  Acute bronchitis Chest x-ray, continue Flonase, adding Tessalon Perles. Return if no better in 2 weeks.

## 2016-01-20 NOTE — Assessment & Plan Note (Signed)
Unable to afford Belviq with discount coupon. Refilling phentermine. Return in one month, entering the second month.

## 2016-01-20 NOTE — Assessment & Plan Note (Signed)
Bilateral C7 symptoms, patient will be starting physical therapy next week

## 2016-01-21 ENCOUNTER — Ambulatory Visit (INDEPENDENT_AMBULATORY_CARE_PROVIDER_SITE_OTHER): Payer: BLUE CROSS/BLUE SHIELD | Admitting: Physical Therapy

## 2016-01-21 ENCOUNTER — Encounter: Payer: Self-pay | Admitting: Physical Therapy

## 2016-01-21 DIAGNOSIS — R200 Anesthesia of skin: Secondary | ICD-10-CM

## 2016-01-21 DIAGNOSIS — R202 Paresthesia of skin: Secondary | ICD-10-CM

## 2016-01-21 DIAGNOSIS — M436 Torticollis: Secondary | ICD-10-CM | POA: Diagnosis not present

## 2016-01-21 NOTE — Patient Instructions (Addendum)
Trigger Point Dry Needling  . What is Trigger Point Dry Needling (DN)? o DN is a physical therapy technique used to treat muscle pain and dysfunction. Specifically, DN helps deactivate muscle trigger points (muscle knots).  o A thin filiform needle is used to penetrate the skin and stimulate the underlying trigger point. The goal is for a local twitch response (LTR) to occur and for the trigger point to relax. No medication of any kind is injected during the procedure.   . What Does Trigger Point Dry Needling Feel Like?  o The procedure feels different for each individual patient. Some patients report that they do not actually feel the needle enter the skin and overall the process is not painful. Very mild bleeding may occur. However, many patients feel a deep cramping in the muscle in which the needle was inserted. This is the local twitch response.   Marland Kitchen. How Will I feel after the treatment? o Soreness is normal, and the onset of soreness may not occur for a few hours. Typically this soreness does not last longer than two days.  o Bruising is uncommon, however; ice can be used to decrease any possible bruising.  o In rare cases feeling tired or nauseous after the treatment is normal. In addition, your symptoms may get worse before they get better, this period will typically not last longer than 24 hours.   . What Can I do After My Treatment? o Increase your hydration by drinking more water for the next 24 hours. o You may place ice or heat on the areas treated that have become sore, however, do not use heat on inflamed or bruised areas. Heat often brings more relief post needling. o You can continue your regular activities, but vigorous activity is not recommended initially after the treatment for 24 hours. o DN is best combined with other physical therapy such as strengthening, stretching, and other therapies.   TENS UNIT: This is helpful for muscle pain and spasm.   Search and Purchase a TENS  7000 2nd edition at www.tenspros.com. It should be less than $30.     TENS unit instructions: Do not shower or bathe with the unit on Turn the unit off before removing electrodes or batteries If the electrodes lose stickiness add a drop of water to the electrodes after they are disconnected from the unit and place on plastic sheet. If you continued to have difficulty, call the TENS unit company to purchase more electrodes. Do not apply lotion on the skin area prior to use. Make sure the skin is clean and dry as this will help prolong the life of the electrodes. After use, always check skin for unusual red areas, rash or other skin difficulties. If there are any skin problems, does not apply electrodes to the same area. Never remove the electrodes from the unit by pulling the wires. Do not use the TENS unit or electrodes other than as directed. Do not change electrode placement without consultating your therapist or physician. Keep 2 fingers with between each electrode. Wear time ratio is 2:1, on to off times.    For example on for 30 minutes off for 15 minutes and then on for 30 minutes off for 15 minutes  Scapular Retraction (Standing)    With arms at sides, pinch shoulder blades together. Repeat _5-10___ times per set. Do __1__ sets per session. Do __multiple__ sessions per day.  Flexibility: Neck Retraction    Pull head straight back, keeping eyes and jaw level.  Repeat __5-10__ times per set. Do ___1_ sets per session. Do _multiple___ sessions per day.  Flexibility: Upper Trapezius Stretch    Gently grasp right side of head while reaching behind back with other hand. Tilt head away until a gentle stretch is felt. Hold _20___ seconds. Then rotate chin towards armpit on the same side, hold another 20 sec. Repeat __1__ times per set. Do __1__ sets per session. Do __2__ sessions per day.  Copyright  VHI. All rights reserved.

## 2016-01-21 NOTE — Therapy (Signed)
[facility] [address] [place], [place], [address] Phone: [phone]   Fax:  [phone]  Physical Therapy Evaluation  Patient Details  Name: Brady Mccormick MRN: [id number] Date of Birth: Aug 03, 1970 Referring Provider: Dr Roderic Palauhekekkandam  Encounter Date: 01/21/2016      PT End of Session - 01/21/16 0850    Visit Number 1   Number of Visits 8   Date for PT Re-Evaluation 02/18/16   PT Start Time 0850   PT Stop Time 0958   PT Time Calculation (min) 68 min   Activity Tolerance Patient tolerated treatment well      History reviewed. No pertinent past medical history.  Past Surgical History:  Procedure Laterality Date  . BICEPS TENDON REPAIR    . SHOULDER ARTHROSCOPY W/ ROTATOR CUFF REPAIR      There were no vitals filed for this visit.       Subjective Assessment - 01/21/16 0850    Subjective Patient reports he noticed neck pain about 2 yrs ago starting with numbness in bilat hands and in the arms with sleeping. Saw an upper cervical specialist and didn't have a lot of relief.  Current MD thinks he may have a herniated disc at C7   Currently 4-5 days a month he will have symptoms. Progress to having headaches not.    Diagnostic tests not yet, try PT first.    Patient Stated Goals sleep without arm symptoms ( side sleeper ) , get pressure off the nerve.    Currently in Pain? No/denies  only slight discomfort            [facility] PT Assessment - 01/21/16 0001      Assessment   Medical Diagnosis cervical DDD   Referring Provider Dr Roderic Palauhekekkandam   Onset Date/Surgical Date 01/20/14   Hand Dominance Right   Next MD Visit 02/12/16   Prior Therapy not for the neck, Rt shoulder RTC 3 yrs ago with bicep tendon tear.      Precautions   Precautions None     Balance Screen   Has the patient fallen in the past 6 months No     Prior Function   Level of Independence Independent   Vocation Full time employment   Civil engineer, contractingVocation Requirements pastor   Leisure golf, sports - swim. 376 yo son with special needs     Observation/Other Assessments   Focus on Therapeutic Outcomes (FOTO)  46% limited     Posture/Postural Control   Posture/Postural Control Postural limitations   Postural Limitations Forward head     ROM / Strength   AROM / PROM / Strength AROM;Strength     AROM   AROM Assessment Site Shoulder;Cervical   Right/Left Shoulder --  bilat WNL   Cervical Flexion 58   Cervical Extension 52   Cervical - Right Side Bend 35   Cervical - Left Side Bend 33   Cervical - Right Rotation 68   Cervical - Left Rotation 54     Strength   Overall Strength Comments mid and lower trap 5-/5   Strength Assessment Site Shoulder   Right/Left Shoulder --  bilat WNL, elbows WNL, grip Rt 93#, Lt 87#     Palpation   Spinal mobility hypomobile with pain CPA C2-3, Lt UPA C5-7   Palpation comment tightness and trigger points in bilat upper traps, levators, periocciput and para spinals     Special Tests    Special Tests Cervical   Cervical Tests Spurling's  Spurling's   Findings Negative   Side --  bilat                   [facility] Adult PT Treatment/Exercise - 01/21/16 0001      Exercises   Exercises Neck     Modalities   Modalities Electrical Stimulation;Moist Heat     Moist Heat Therapy   Number Minutes Moist Heat 15 Minutes   Moist Heat Location Cervical     Electrical Stimulation   Electrical Stimulation Location cervical   Electrical Stimulation Action IFC   Electrical Stimulation Parameters to tolerance   Electrical Stimulation Goals Tone;Pain     Manual Therapy   Manual Therapy Soft tissue mobilization   Soft tissue mobilization bilat upper traps,levators and cervical paraspinals.      Neck Exercises: Stretches   Upper Trapezius Stretch 1 rep;20 seconds   Levator Stretch 1 rep;20 seconds   Other Neck Stretches scap and cervicalretraction          Trigger Point Dry  Needling - 01/21/16 0944    Consent Given? Yes   Education Handout Provided Yes   Muscles Treated Upper Body Upper trapezius;Longissimus  bilat , cervical    Upper Trapezius Response Palpable increased muscle length;Twitch reponse elicited   Longissimus Response Twitch response elicited;Palpable increased muscle length              PT Education - 01/21/16 0944    Education provided Yes   Education Details HEP, TENs, TDN   Person(s) Educated Patient   Methods Explanation;Demonstration;Handout   Comprehension Returned demonstration;Verbalized understanding             PT Long Term Goals - 01/21/16 0952      PT LONG TERM GOAL #1   Title I with advanced HEP ( 02/18/16)    Time 4   Period Weeks   Status New     PT LONG TERM GOAL #2   Title report overall decrease in numbness =/> 75% with sleeping ( 02/18/16)    Time 4   Period Weeks   Status New     PT LONG TERM GOAL #3   Title demo bilat cervical rotation =/> 70 degrees without tightness ( 02/18/16)    Time 4   Period Weeks   Status New     PT LONG TERM GOAL #4   Title improve FOTO =/< 34% limited ( 02/18/16)    Time 4   Period Weeks   Status New               Plan - 01/21/16 1610    Clinical Impression Statement 45 yo male with h/o cervical pain that has progressed to having numbness in bilat UE's.  His strength is good throughout the upper body, he has some stiffness in his neck and multiple trigger points.     Rehab Potential Excellent   PT Frequency 2x / week   PT Duration 4 weeks   PT Treatment/Interventions Traction;Ultrasound;Moist Heat;Therapeutic exercise;Dry needling;Taping;Manual techniques;Neuromuscular re-education;Cryotherapy;Electrical Stimulation;Patient/family education;Passive range of motion   PT Next Visit Plan manual work to cervical area, cervical stabilization ex.    Consulted and Agree with Plan of Care Patient      Patient will benefit from skilled therapeutic intervention  in order to improve the following deficits and impairments:  Pain, Hypomobility, Decreased range of motion  Visit Diagnosis: Stiffness of neck - Plan: PT plan of care cert/re-cert  Bilateral arm numbness and tingling while sleeping - Plan:  PT plan of care cert/re-cert     Problem List Patient Active Problem List   Diagnosis Date Noted  . Acute bronchitis 01/20/2016  . DDD (degenerative disc disease), cervical 12/16/2015  . Primary osteoarthritis of both hands 12/16/2015  . Nephrolithiasis 07/22/2015  . Chronic idiopathic constipation 07/22/2015  . Depression 01/07/2015  . Obesity 01/07/2015  . Osteoarthritis of left hip 01/07/2015  . Preventive measure 01/07/2015    Roderic ScarceSusan Kawhi Diebold PT  01/21/2016, 10:00 AM  [facility] [address] [place], [place], [address] Phone: [phone]   Fax:  [phone]  Name: Brady Mccormick MRN: [id number] Date of Birth: 11/16/1970

## 2016-01-27 ENCOUNTER — Encounter: Payer: Self-pay | Admitting: Physical Therapy

## 2016-01-27 ENCOUNTER — Ambulatory Visit (INDEPENDENT_AMBULATORY_CARE_PROVIDER_SITE_OTHER): Payer: BLUE CROSS/BLUE SHIELD | Admitting: Physical Therapy

## 2016-01-27 DIAGNOSIS — M436 Torticollis: Secondary | ICD-10-CM

## 2016-01-27 DIAGNOSIS — R200 Anesthesia of skin: Secondary | ICD-10-CM

## 2016-01-27 DIAGNOSIS — R202 Paresthesia of skin: Secondary | ICD-10-CM
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
## 2016-01-30 ENCOUNTER — Encounter: Payer: BLUE CROSS/BLUE SHIELD | Admitting: Physical Therapy

## 2016-02-02 ENCOUNTER — Encounter: Payer: BLUE CROSS/BLUE SHIELD | Admitting: Rehabilitative and Restorative Service Providers"

## 2016-02-10 ENCOUNTER — Encounter: Payer: Self-pay | Admitting: Physical Therapy

## 2016-02-10 ENCOUNTER — Ambulatory Visit (INDEPENDENT_AMBULATORY_CARE_PROVIDER_SITE_OTHER): Payer: BLUE CROSS/BLUE SHIELD | Admitting: Physical Therapy

## 2016-02-10 DIAGNOSIS — M436 Torticollis: Secondary | ICD-10-CM

## 2016-02-10 DIAGNOSIS — R200 Anesthesia of skin: Secondary | ICD-10-CM

## 2016-02-10 DIAGNOSIS — R202 Paresthesia of skin: Secondary | ICD-10-CM | POA: Diagnosis not present
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
## 2016-02-13 ENCOUNTER — Encounter: Payer: Self-pay | Admitting: Rehabilitative and Restorative Service Providers"

## 2016-02-13 ENCOUNTER — Ambulatory Visit (INDEPENDENT_AMBULATORY_CARE_PROVIDER_SITE_OTHER): Payer: BLUE CROSS/BLUE SHIELD | Admitting: Rehabilitative and Restorative Service Providers"

## 2016-02-13 DIAGNOSIS — M436 Torticollis: Secondary | ICD-10-CM | POA: Diagnosis not present

## 2016-02-13 DIAGNOSIS — R200 Anesthesia of skin: Secondary | ICD-10-CM

## 2016-02-13 DIAGNOSIS — R202 Paresthesia of skin: Secondary | ICD-10-CM

## 2016-02-13 NOTE — Therapy (Signed)
[facility] [address] [place], [place], [address] Phone: [phone]   Fax:  [phone]  Physical Therapy Treatment  Patient Details  Name: Brady BambergDaniel Bogen MRN: [id number] Date of Birth: Jul 23, 1970 Referring Provider: Dr Benjamin Stainhekkekandam  Encounter Date: 02/13/2016      PT End of Session - 02/13/16 0848    Visit Number 4   Number of Visits 8   Date for PT Re-Evaluation 02/18/16   PT Start Time 0845   PT Stop Time 0942   PT Time Calculation (min) 57 min   Activity Tolerance Patient tolerated treatment well      History reviewed. No pertinent past medical history.  Past Surgical History:  Procedure Laterality Date  . BICEPS TENDON REPAIR    . SHOULDER ARTHROSCOPY W/ ROTATOR CUFF REPAIR      There were no vitals filed for this visit.      Subjective Assessment - 02/13/16 0848    Subjective Back is still the biggest problem. Treatment last visit seemed to help some and he feels the back is maybe a bit better - but he continues to have pain limiting activity.    Currently in Pain? Yes   Pain Score 5    Pain Location Back   Pain Orientation Left   Pain Descriptors / Indicators Sharp   Pain Onset 1 to 4 weeks ago   Pain Frequency Constant  intensity varies                          [facility] Adult PT Treatment/Exercise - 02/13/16 0001      Neck Exercises: Machines for Strengthening   UBE (Upper Arm Bike) L2 x 4' Alt FWD/BWD     Neck Exercises: Prone   Other Prone Exercise pelvic press 5 sec x 10      Lumbar Exercises: Stretches   Piriformis Stretch 3 reps;30 seconds  travell     Modalities   Modalities Electrical Stimulation;Moist Heat     Moist Heat Therapy   Number Minutes Moist Heat 20 Minutes   Moist Heat Location Lumbar Spine  Lt hip      Electrical Stimulation   Electrical Stimulation Location Lt lumbar to piriformis   Electrical Stimulation Action IFC   Electrical Stimulation  Parameters to tolerance   Electrical Stimulation Goals Tone;Pain     Manual Therapy   Joint Mobilization lower lumbar mobs, Lt SIJ mobs   Soft tissue mobilization Lt gluts, piriformis and QL    Myofascial Release Lt hip - piriformis/gluts   Passive ROM Lt hip rotation/stretch w/ pt prone knee flexed          Trigger Point Dry Needling - 02/13/16 0930    Consent Given? Yes   Muscles Treated Upper Body --  lumbar - multifidi area all TDN LT    Gluteus Maximus Response Palpable increased muscle length   Gluteus Minimus Response Palpable increased muscle length   Piriformis Response Twitch response elicited;Palpable increased muscle length                   PT Long Term Goals - 02/10/16 0940      PT LONG TERM GOAL #1   Title I with advanced HEP ( 02/18/16)    Status On-going     PT LONG TERM GOAL #2   Title report overall decrease in numbness =/> 75% with sleeping ( 02/18/16)    Status On-going  PT LONG TERM GOAL #3   Title demo bilat cervical rotation =/> 70 degrees without tightness ( 02/18/16)    Status On-going     PT LONG TERM GOAL #4   Title improve FOTO =/< 34% limited ( 02/18/16)    Status On-going               Plan - 02/13/16 0931    Clinical Impression Statement Improvement reported in Lt lumbarand hip pain and dysfunction. Good response to TDN and manual work followed by stretching and modalities.    Rehab Potential Excellent   PT Frequency 2x / week   PT Duration 4 weeks   PT Treatment/Interventions Traction;Ultrasound;Moist Heat;Therapeutic exercise;Dry needling;Taping;Manual techniques;Neuromuscular re-education;Cryotherapy;Electrical Stimulation;Patient/family education;Passive range of motion   PT Next Visit Plan manual work cervical region. Lt SIJ PRN.  possible trial of cervical traction.  Will need re-assessment next week. Core stabilization from lumbar to cervical    Consulted and Agree with Plan of Care Patient      Patient will  benefit from skilled therapeutic intervention in order to improve the following deficits and impairments:  Pain, Hypomobility, Decreased range of motion  Visit Diagnosis: Stiffness of neck  Bilateral arm numbness and tingling while sleeping     Problem List Patient Active Problem List   Diagnosis Date Noted  . Acute bronchitis 01/20/2016  . DDD (degenerative disc disease), cervical 12/16/2015  . Primary osteoarthritis of both hands 12/16/2015  . Nephrolithiasis 07/22/2015  . Chronic idiopathic constipation 07/22/2015  . Depression 01/07/2015  . Obesity 01/07/2015  . Osteoarthritis of left hip 01/07/2015  . Preventive measure 01/07/2015    Miliani Deike Rober MinionP Daimion Adamcik PT, MPH  02/13/2016, 9:33 AM  [facility] [address] [place], [place], [address] Phone: [phone]   Fax:  [phone]  Name: Brady BambergDaniel Terlecki MRN: [id number] Date of Birth: Aug 11, 1970

## 2016-02-17 ENCOUNTER — Encounter: Payer: BLUE CROSS/BLUE SHIELD | Admitting: Physical Therapy

## 2016-02-18 ENCOUNTER — Ambulatory Visit (INDEPENDENT_AMBULATORY_CARE_PROVIDER_SITE_OTHER): Payer: BLUE CROSS/BLUE SHIELD | Admitting: Physical Therapy

## 2016-02-18 DIAGNOSIS — R200 Anesthesia of skin: Secondary | ICD-10-CM | POA: Diagnosis not present

## 2016-02-18 DIAGNOSIS — M436 Torticollis: Secondary | ICD-10-CM | POA: Diagnosis not present

## 2016-02-18 DIAGNOSIS — R202 Paresthesia of skin: Secondary | ICD-10-CM | POA: Diagnosis not present

## 2016-02-18 NOTE — Therapy (Addendum)
[place], [place], [address] Phone: [phone]   Fax:  [phone]  Physical Therapy Treatment  Patient Details  Name: Brady Mccormick MRN: [id number] Date of Birth: Nov 16, 1970 Referring Provider: Dr. Helane Rima  Encounter Date: 02/18/2016      PT End of Session - 02/18/16 0825    Visit Number 5   Number of Visits 20   Date for PT Re-Evaluation 04/01/16   PT Start Time 0814  pt arrived late   PT Stop Time 0912   PT Time Calculation (min) 58 min   Activity Tolerance Patient tolerated treatment well;No increased pain      No past medical history on file.  Past Surgical History:  Procedure Laterality Date  . BICEPS TENDON REPAIR    . SHOULDER ARTHROSCOPY W/ ROTATOR CUFF REPAIR      There were no vitals filed for this visit.      Subjective Assessment - 02/18/16 0829    Subjective Lower back has calmed down, pain now only when move certain ways. Numbness in bilat hands is persistent.  "Head feels like 50# by end of day".  He hasn't worked out for 2 wks due to fear of flaring up lower back.    Diagnostic tests not yet, try PT first.    Patient Stated Goals sleep without arm symptoms ( side sleeper ) , get pressure off the nerve.    Currently in Pain? No/denies  just numbness in bilat hands            [facility] PT Assessment - 02/18/16 0001      Assessment   Medical Diagnosis cervical DDD   Referring Provider Dr. Helane Rima   Onset Date/Surgical Date 01/20/14   Hand Dominance Right     Observation/Other Assessments   Focus on Therapeutic Outcomes (FOTO)  53% limited          [facility] Adult PT Treatment/Exercise - 02/18/16 0001      Neck Exercises: Machines for Strengthening   UBE (Upper Arm Bike) L3 x 4' Alt FWD/BWD     Neck Exercises: Prone   Axial Exentsion 10 reps   Axial Extension Limitations demo and tactile cues for form.    W Back 10 reps  with axial ext.    Other  Prone Exercise pelvic press 5 sec x 10 reps, then with unilateral knee bend  x 10 reps      Lumbar Exercises: Stretches   Piriformis Stretch 2 reps;30 seconds     Lumbar Exercises: Quadruped   [place] Horse 5 reps     Modalities   Modalities Electrical Stimulation;Moist Heat;Traction     Moist Heat Therapy   Number Minutes Moist Heat 15 Minutes   Moist Heat Location Lumbar Spine     Electrical Stimulation   Electrical Stimulation Location Lt SI joint/  Rt upper trap   Electrical Stimulation Action premod to each area.    Electrical Stimulation Parameters to pt tolerance    Electrical Stimulation Goals Tone;Pain     Traction   Min (lbs) 14   Max (lbs) 7   Hold Time 60   Rest Time 20   Time 12     Neck Exercises: Stretches   Upper Trapezius Stretch 2 reps;20 seconds   Levator Stretch 2 reps;20 seconds   Other Neck Stretches 3 position doorway stretch x 30 sec (mid and high level performed unilateral)  PT Long Term Goals - 02/18/16 0914      PT LONG TERM GOAL #1   Title I with advanced HEP ( 04/01/16)    Time 4   Period Weeks   Status On-going     PT LONG TERM GOAL #2   Title report overall decrease in numbness =/> 75% with sleeping ( 118/18)    Time 4   Period Weeks   Status On-going     PT LONG TERM GOAL #3   Title demo bilat cervical rotation =/> 70 degrees without tightness ( 04/01/16)    Time 4   Period Weeks   Status On-going     PT LONG TERM GOAL #4   Title improve FOTO =/< 34% limited ( 04/01/16)    Time 4   Period Weeks   Status On-going               Plan - 02/18/16 0915    Clinical Impression Statement Pt has had a good response to TDN in previous sessions.  He tolerated all exercises well, without increase in symptoms.  Trial of cervical traction initiated today.  He is making gradual progress towards established goals and will benefit from continued PT intervention to decrease symptoms and improve  functional mobility.     Rehab Potential Excellent   PT Frequency 2x / week   PT Duration 4 weeks   PT Treatment/Interventions Traction;Ultrasound;Moist Heat;Therapeutic exercise;Dry needling;Taping;Manual techniques;Neuromuscular re-education;Cryotherapy;Electrical Stimulation;Patient/family education;Passive range of motion   PT Next Visit Plan Spoke to supervising PT regarding pt's progress and his desire to cont therapy.  Assess hip symmetry (rotated on Lt?), assess response to cervical traction. Progress HEP.    Consulted and Agree with Plan of Care Patient      Patient will benefit from skilled therapeutic intervention in order to improve the following deficits and impairments:  Pain, Hypomobility, Decreased range of motion  Visit Diagnosis: Stiffness of neck - Plan: PT plan of care cert/re-cert  Bilateral arm numbness and tingling while sleeping - Plan: PT plan of care cert/re-cert     Problem List Patient Active Problem List   Diagnosis Date Noted  . Acute bronchitis 01/20/2016  . DDD (degenerative disc disease), cervical 12/16/2015  . Primary osteoarthritis of both hands 12/16/2015  . Nephrolithiasis 07/22/2015  . Chronic idiopathic constipation 07/22/2015  . Depression 01/07/2015  . Obesity 01/07/2015  . Osteoarthritis of left hip 01/07/2015  . Preventive measure 01/07/2015   Kerin Perna, PTA 02/18/16 12:04 PM  [place] , [place], [address] Phone: [phone]   Fax:  [phone]  Name: Macklin Jacquin MRN: [id number] Date of Birth: 12-10-1970  PHYSICAL THERAPY DISCHARGE SUMMARY  Visits from Start of Care: 5  Current functional level related to goals / functional outcomes: unknown   Remaining deficits: unknown   Education / Equipment: HEP Plan:                                                    Patient goals were not met. Patient is being discharged due to not  returning since the last visit.  ?????     Jeral Pinch, PT 03/19/16 8:40 AM

## 2016-02-18 NOTE — Addendum Note (Signed)
Addended by: Val RilesHOLT, Cedra Villalon P on: 02/18/2016 12:04 PM   Modules accepted: Orders

## 2016-02-19 ENCOUNTER — Ambulatory Visit (INDEPENDENT_AMBULATORY_CARE_PROVIDER_SITE_OTHER): Payer: BLUE CROSS/BLUE SHIELD

## 2016-02-19 ENCOUNTER — Ambulatory Visit (INDEPENDENT_AMBULATORY_CARE_PROVIDER_SITE_OTHER): Payer: BLUE CROSS/BLUE SHIELD | Admitting: Sports Medicine

## 2016-02-19 ENCOUNTER — Encounter: Payer: Self-pay | Admitting: Sports Medicine

## 2016-02-19 DIAGNOSIS — M48061 Spinal stenosis, lumbar region without neurogenic claudication: Secondary | ICD-10-CM | POA: Diagnosis not present

## 2016-02-19 DIAGNOSIS — M545 Low back pain, unspecified: Secondary | ICD-10-CM

## 2016-02-19 DIAGNOSIS — E6609 Other obesity due to excess calories: Secondary | ICD-10-CM

## 2016-02-19 DIAGNOSIS — K5904 Chronic idiopathic constipation: Secondary | ICD-10-CM

## 2016-02-19 MED ORDER — PHENTERMINE HCL 37.5 MG PO TABS: ORAL_TABLET | ORAL | 0 refills | Status: DC

## 2016-02-19 MED ORDER — LUBIPROSTONE 24 MCG PO CAPS: 24.0000 ug | ORAL_CAPSULE | Freq: Two times a day (BID) | ORAL | 0 refills | Status: DC

## 2016-02-19 NOTE — Assessment & Plan Note (Signed)
Continues to have constipation. Adding a free trial for Amitiza

## 2016-02-19 NOTE — Assessment & Plan Note (Signed)
Good weight loss after the second month.

## 2016-02-19 NOTE — Assessment & Plan Note (Signed)
Continue physical therapy for the cervical spine as well as the lumbar spine now. I'm going to get x-rays, he is improving.

## 2016-02-19 NOTE — Progress Notes (Signed)
  Subjective:    CC: Follow-up  HPI:. Obesity: Moderate weight loss after the second month on phentermine.  Constipation: Was never able to get Amitiza before. He has tried over-the-counter agents without much improvement.  Back pain: After playing with his son, had tremendous back pain, has been working in physical therapy and it is nearly resolved.  Past medical history:  Negative.  See flowsheet/record as well for more information.  Surgical history: Negative.  See flowsheet/record as well for more information.  Family history: Negative.  See flowsheet/record as well for more information.  Social history: Negative.  See flowsheet/record as well for more information.  Allergies, and medications have been entered into the medical record, reviewed, and no changes needed.   Review of Systems: No fevers, chills, night sweats, weight loss, chest pain, or shortness of breath.   Objective:    General: Well Developed, well nourished, and in no acute distress.  Neuro: Alert and oriented x3, extra-ocular muscles intact, sensation grossly intact.  HEENT: Normocephalic, atraumatic, pupils equal round reactive to light, neck supple, no masses, no lymphadenopathy, thyroid nonpalpable.  Skin: Warm and dry, no rashes. Cardiac: Regular rate and rhythm, no murmurs rubs or gallops, no lower extremity edema.  Respiratory: Clear to auscultation bilaterally. Not using accessory muscles, speaking in full sentences.  Impression and Recommendations:    Obesity Good weight loss after the second month.   Chronic idiopathic constipation Continues to have constipation. Adding a free trial for Amitiza  Low back pain Continue physical therapy for the cervical spine as well as the lumbar spine now. I'm going to get x-rays, he is improving.

## 2016-03-03 ENCOUNTER — Other Ambulatory Visit: Payer: Self-pay | Admitting: Physician Assistant

## 2016-03-03 DIAGNOSIS — K5904 Chronic idiopathic constipation: Secondary | ICD-10-CM

## 2016-03-05 ENCOUNTER — Other Ambulatory Visit: Payer: Self-pay | Admitting: Physician Assistant

## 2016-03-23 ENCOUNTER — Ambulatory Visit: Payer: BLUE CROSS/BLUE SHIELD | Admitting: Sports Medicine

## 2016-03-24 ENCOUNTER — Ambulatory Visit (INDEPENDENT_AMBULATORY_CARE_PROVIDER_SITE_OTHER): Payer: BLUE CROSS/BLUE SHIELD | Admitting: Sports Medicine

## 2016-03-24 ENCOUNTER — Encounter: Payer: Self-pay | Admitting: Sports Medicine

## 2016-03-24 ENCOUNTER — Telehealth: Payer: Self-pay

## 2016-03-24 DIAGNOSIS — E669 Obesity, unspecified: Secondary | ICD-10-CM

## 2016-03-24 DIAGNOSIS — K5904 Chronic idiopathic constipation: Secondary | ICD-10-CM | POA: Diagnosis not present

## 2016-03-24 DIAGNOSIS — E6609 Other obesity due to excess calories: Secondary | ICD-10-CM | POA: Diagnosis not present

## 2016-03-24 MED ORDER — POLYETHYLENE GLYCOL 3350 17 G PO PACK: 17.0000 g | PACK | Freq: Two times a day (BID) | ORAL | 11 refills | Status: DC

## 2016-03-24 MED ORDER — PHENTERMINE HCL 8 MG PO TABS: ORAL_TABLET | ORAL | 0 refills | Status: DC

## 2016-03-24 MED ORDER — LINACLOTIDE 290 MCG PO CAPS: 290.0000 ug | ORAL_CAPSULE | Freq: Every day | ORAL | 3 refills | Status: DC

## 2016-03-24 MED ORDER — SENNOSIDES-DOCUSATE SODIUM 8.6-50 MG PO TABS: 2.0000 | ORAL_TABLET | Freq: Two times a day (BID) | ORAL | 0 refills | Status: DC

## 2016-03-24 MED ORDER — BELVIQ XR 20 MG PO TB24: 1.0000 | ORAL_TABLET | Freq: Every day | ORAL | 3 refills | Status: DC

## 2016-03-24 NOTE — Assessment & Plan Note (Signed)
Was finally able to get Belviq. Has not lost any weight, I'm going to taper him down off of phentermine. Return in 3 months for a weight check.  Continue Belviq.

## 2016-03-24 NOTE — Telephone Encounter (Signed)
Done

## 2016-03-24 NOTE — Telephone Encounter (Signed)
It was in Epic so somebody makes it, even if that means they don't. Tell him that they can switch it to the 15 mg and have him do one tab daily for a week then one half tab daily for a week, then stop.

## 2016-03-24 NOTE — Progress Notes (Signed)
  Subjective:    CC:  Follow-up  HPI: Obesity: No weight loss, he was finally able to obtain Belviq.  Constipation: Persistent despite Amitiza which did not seem to help, he did use an herbal supplement recommended by someone in his consultation that seems to help but causes painful defecation 12 hours after taking the medication. Has never had a colonoscopy. No hematochezia or melena.  Past medical history:  Negative.  See flowsheet/record as well for more information.  Surgical history: Negative.  See flowsheet/record as well for more information.  Family history: Negative.  See flowsheet/record as well for more information.  Social history: Negative.  See flowsheet/record as well for more information.  Allergies, and medications have been entered into the medical record, reviewed, and no changes needed.   Review of Systems: No fevers, chills, night sweats, weight loss, chest pain, or shortness of breath.   Objective:    General: Well Developed, well nourished, and in no acute distress.  Neuro: Alert and oriented x3, extra-ocular muscles intact, sensation grossly intact.  HEENT: Normocephalic, atraumatic, pupils equal round reactive to light, neck supple, no masses, no lymphadenopathy, thyroid nonpalpable.  Skin: Warm and dry, no rashes. Cardiac: Regular rate and rhythm, no murmurs rubs or gallops, no lower extremity edema.  Respiratory: Clear to auscultation bilaterally. Not using accessory muscles, speaking in full sentences.  Impression and Recommendations:    Obesity Was finally able to get Belviq. Has not lost any weight, I'm going to taper him down off of phentermine. Return in 3 months for a weight check.  Continue Belviq.  Chronic idiopathic constipation Multiple agents including Amitiza have been ineffective, he did try an herbal type supplement that seemed to be effective but was painful. We are going to have him come off of phentermine which will help, I'm also going  to add a regimen of Linzess, MiraLAX, Senokot S. If this still doesn't work he will need to be evaluated by gastroenterology to look for an obstruction.  I spent 25 minutes with this patient, greater than 50% was face-to-face time counseling regarding the above diagnoses

## 2016-03-24 NOTE — Assessment & Plan Note (Addendum)
Multiple agents including Amitiza have been ineffective, he did try an herbal type supplement that seemed to be effective but was painful. We are going to have him come off of phentermine which will help, I'm also going to add a regimen of Linzess, MiraLAX, Senokot S. If this still doesn't work he will need to be evaluated by gastroenterology to look for an obstruction.

## 2016-03-24 NOTE — Telephone Encounter (Signed)
[facility] called stating questioning the dosage and sig for Phentermine that was written today advising that they do not make 8 MG tablets of this medication. Please review rx and modify it accordingly.

## 2016-03-26 ENCOUNTER — Telehealth: Payer: Self-pay | Admitting: *Deleted

## 2016-03-26 MED ORDER — PHENTERMINE HCL 37.5 MG PO TABS: ORAL_TABLET | ORAL | 0 refills | Status: DC

## 2016-03-26 NOTE — Telephone Encounter (Signed)
Ok since they're being ridiculously difficult I'm going to add an rx for standard dose phentermine, he should do one half tab daily for a week and then stop, prescription is in my box.

## 2016-03-26 NOTE — Telephone Encounter (Signed)
The pharmacy called and states the 15 mg phentermine comes in a capsule so won't be able to cut in half.

## 2016-03-30 NOTE — Telephone Encounter (Signed)
Message left on vm 

## 2016-04-16 ENCOUNTER — Other Ambulatory Visit: Payer: Self-pay | Admitting: Physician Assistant

## 2016-04-16 DIAGNOSIS — M19041 Primary osteoarthritis, right hand: Secondary | ICD-10-CM

## 2016-04-16 DIAGNOSIS — M19042 Primary osteoarthritis, left hand: Principal | ICD-10-CM

## 2016-04-26 ENCOUNTER — Ambulatory Visit: Payer: BLUE CROSS/BLUE SHIELD | Admitting: Sports Medicine

## 2016-05-28 DIAGNOSIS — M62838 Other muscle spasm: Secondary | ICD-10-CM | POA: Insufficient documentation

## 2016-05-28 DIAGNOSIS — G5603 Carpal tunnel syndrome, bilateral upper limbs: Secondary | ICD-10-CM | POA: Insufficient documentation

## 2016-07-02 DIAGNOSIS — R3129 Other microscopic hematuria: Secondary | ICD-10-CM | POA: Insufficient documentation

## 2017-06-14 ENCOUNTER — Ambulatory Visit (INDEPENDENT_AMBULATORY_CARE_PROVIDER_SITE_OTHER): Payer: BLUE CROSS/BLUE SHIELD

## 2017-06-14 ENCOUNTER — Other Ambulatory Visit: Payer: Self-pay | Admitting: Physician Assistant

## 2017-06-14 ENCOUNTER — Encounter: Payer: Self-pay | Admitting: Podiatry

## 2017-06-14 ENCOUNTER — Ambulatory Visit: Payer: BLUE CROSS/BLUE SHIELD | Admitting: Podiatry

## 2017-06-14 VITALS — BP 113/67 | HR 89 | Resp 16

## 2017-06-14 DIAGNOSIS — M722 Plantar fascial fibromatosis: Secondary | ICD-10-CM

## 2017-06-14 MED ORDER — METHYLPREDNISOLONE 4 MG PO TBPK: ORAL_TABLET | ORAL | 0 refills | Status: DC

## 2017-06-14 NOTE — Patient Instructions (Signed)

## 2017-06-14 NOTE — Progress Notes (Signed)
  Subjective:  Patient ID: Brady Mccormick, male    DOB: 1971-01-01,  MRN: [id number] HPI Chief Complaint  Patient presents with  . Foot Pain    Arch/medial foot left - aching x 4 months, AM pain, tried night splint bought online-some help, now sharp sensations in hallux left x couple weeks    47 y.o. male presents with the above complaint.   ROS: Denies fever chills nausea vomiting muscle aches pains calf pain shortness of breath chest pain and headache.  No past medical history on file. Past Surgical History:  Procedure Laterality Date  . BICEPS TENDON REPAIR    . SHOULDER ARTHROSCOPY W/ ROTATOR CUFF REPAIR      Current Outpatient Medications:  .  fluticasone (FLONASE) 50 MCG/ACT nasal spray, USE AS DIRECTED, Disp: 16 g, Rfl: 3 .  methylPREDNISolone (MEDROL DOSEPAK) 4 MG TBPK tablet, 6 day dose pack - take as directed, Disp: 21 tablet, Rfl: 0  Allergies  Allergen Reactions  . Penicillins    Review of Systems Objective:   Vitals:   06/14/17 1132  BP: 113/67  Pulse: 89  Resp: 16    General: Well developed, nourished, in no acute distress, alert and oriented x3   Dermatological: Skin is warm, dry and supple bilateral. Nails x 10 are well maintained; remaining integument appears unremarkable at this time. There are no open sores, no preulcerative lesions, no rash or signs of infection present.  Vascular: Dorsalis Pedis artery and Posterior Tibial artery pedal pulses are 2/4 bilateral with immedate capillary fill time. Pedal hair growth present. No varicosities and no lower extremity edema present bilateral.   Neruologic: Grossly intact via light touch bilateral. Vibratory intact via tuning fork bilateral. Protective threshold with Semmes Wienstein monofilament intact to all pedal sites bilateral. Patellar and Achilles deep tendon reflexes 2+ bilateral. No Babinski or clonus noted bilateral.   Musculoskeletal: No gross boney pedal deformities bilateral. No pain, crepitus, or  limitation noted with foot and ankle range of motion bilateral. Muscular strength 5/5 in all groups tested bilateral.  Gait: Unassisted, Nonantalgic.    Radiographs:  3 views left foot taken today demonstrates an osseously mature individual soft tissue increase in density plantar fascial calcaneal insertion site.  Foot appears to be rectus no acute abnormalities.  F2  Assessment & Plan:   Assessment: Plantar fasciitis left with compensatory tibialis anterior insertional tendinitis and tibialis posterior tendinitis left foot.  Plan: Discussed etiology pathology conservative versus surgical therapies.  At this point I will start him on a Medrol Dosepak and he will follow-up with the meloxicam that he has at home.  I also placed him in a plantar fascial brace after an injection of 20 mg of Kenalog and 5 mg of Marcaine to the point of maximal tenderness medial aspect of the left heel.  He tolerated procedure well without complications.  Discussed appropriate shoe gear stretching exercises ice therapy as your modifications follow-up with him in 1 month.     Charlotte Fidalgo T. [place], [place]

## 2017-06-14 NOTE — Addendum Note (Signed)
Addended by: Lottie RaterPREVETTE, ASHLEY E on: 06/14/2017 02:08 PM   Modules accepted: Orders

## 2017-07-14 ENCOUNTER — Ambulatory Visit: Payer: BLUE CROSS/BLUE SHIELD | Admitting: Podiatry

## 2017-12-05 IMAGING — DX DG LUMBAR SPINE COMPLETE 4+V
5 series · 5 of 5 positions shown · non-contrast
Comparison: Coronal and sagittal images from an abdominal and
pelvic CT scan June 03, 2011.

CLINICAL DATA: Severe left-sided low back pain radiating into the
left buttock for the past 2-3 weeks. Possible injury but mechanism
is un clear. History of degenerative disease of the cervical spine
and left hip.

EXAM:
LUMBAR SPINE - COMPLETE 4+ VIEW

[l-spine ap]
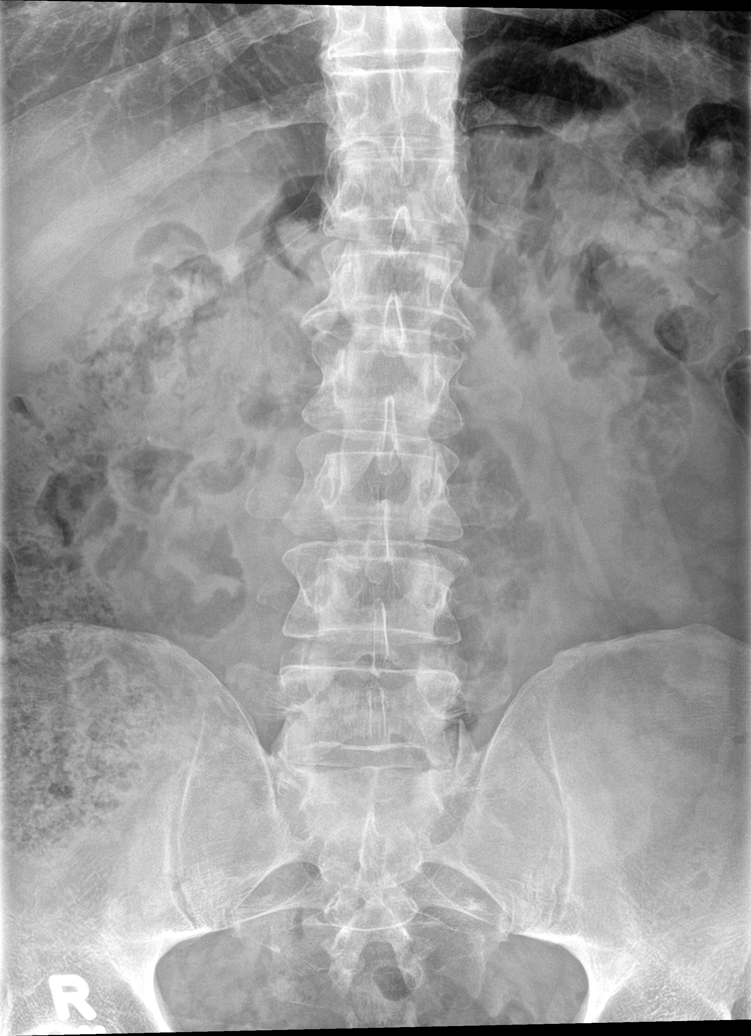

[l-spine obl (1 of 2)]
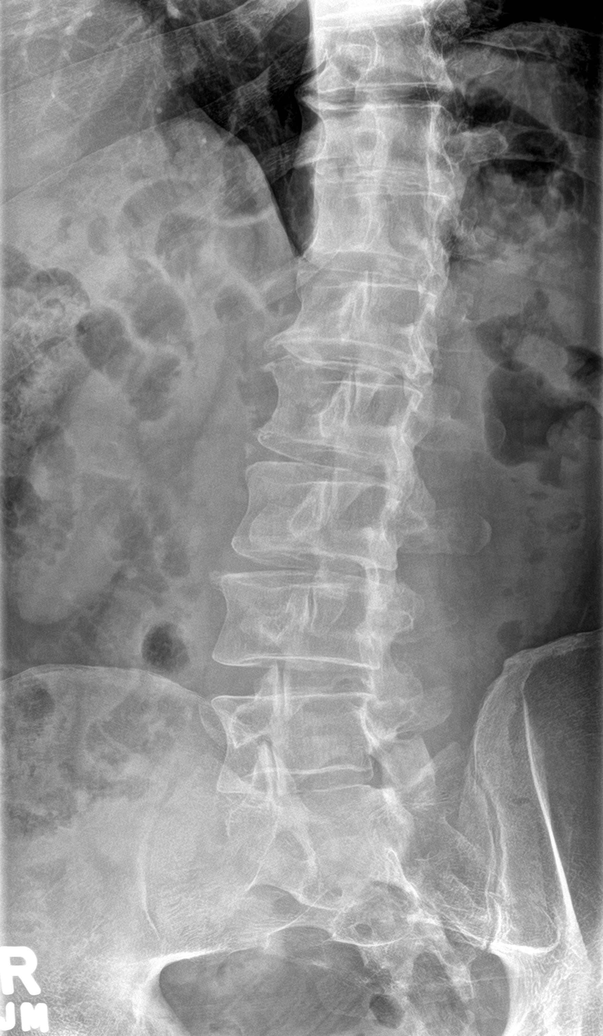

[l-spine obl (2 of 2)]
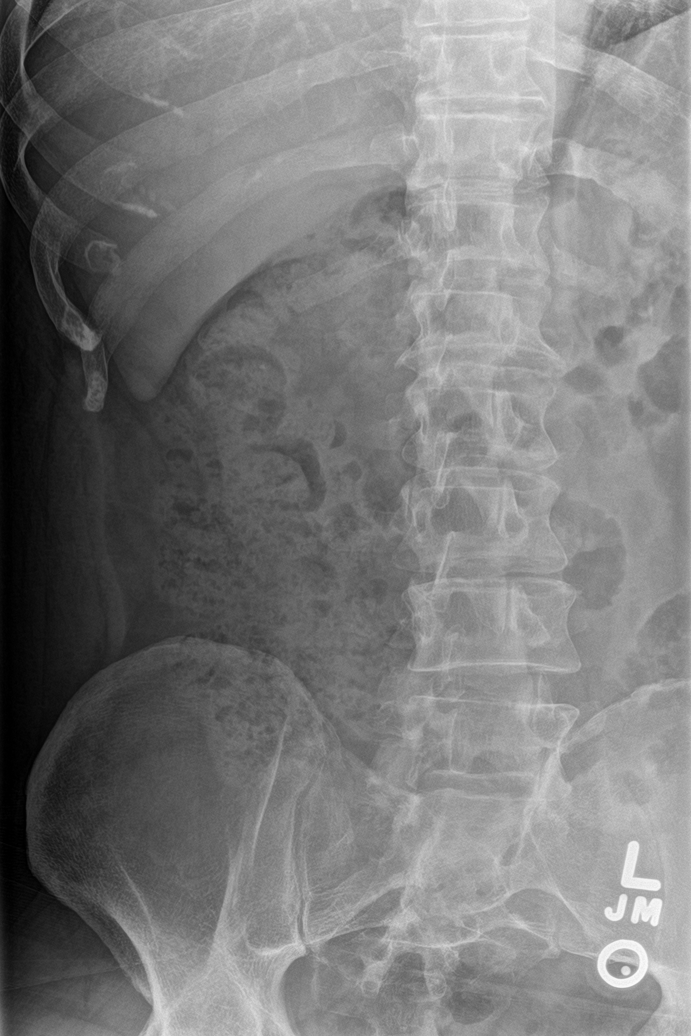

[l-spine lat]
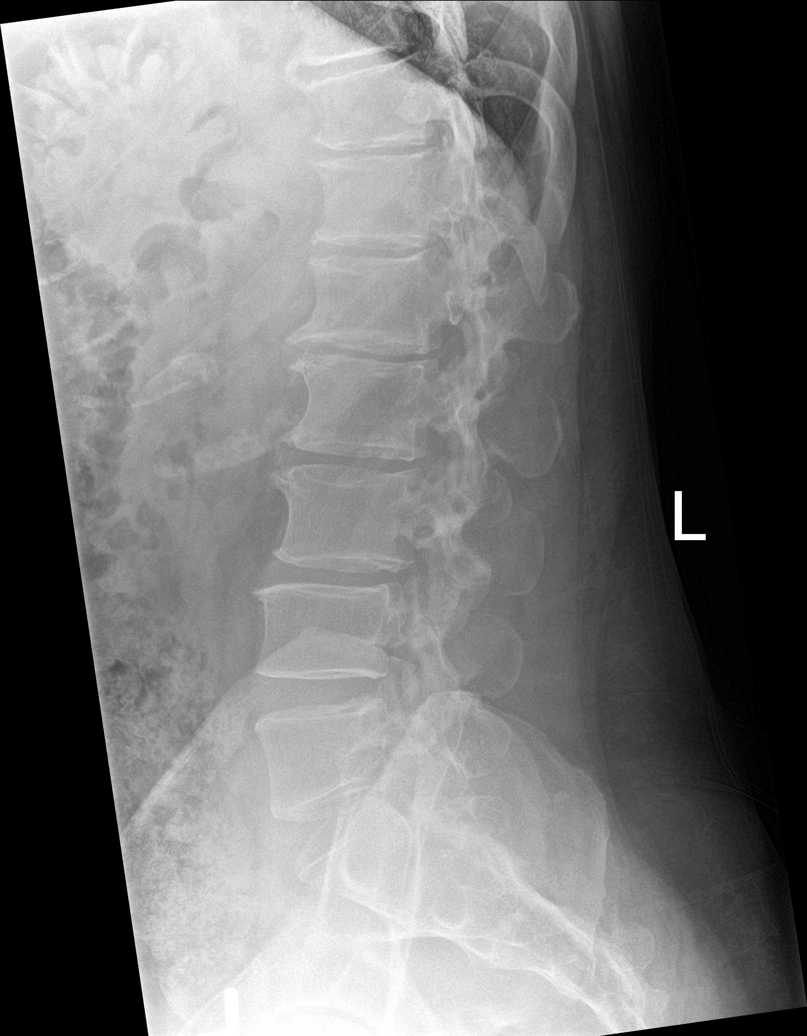

[l-spine spot]
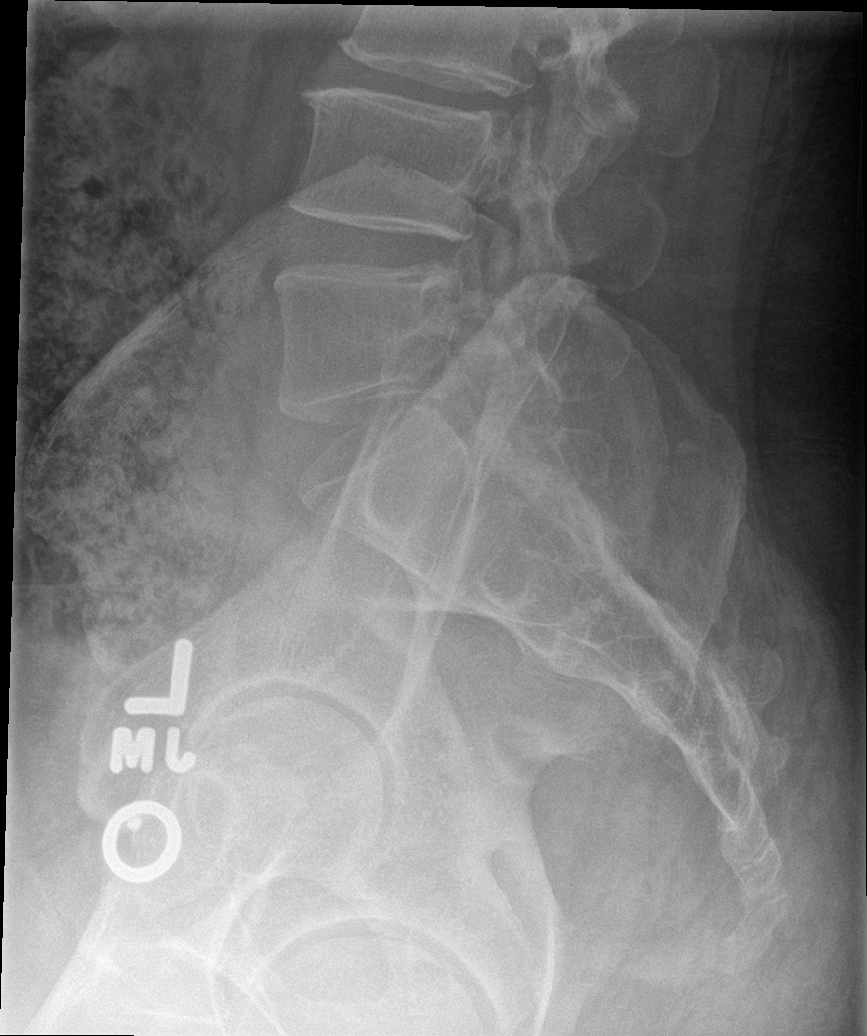

[5 of 5 positions shown; findings below may reference images not displayed]

FINDINGS: The lumbar vertebral bodies are preserved in height. There is disc
space narrowing at T12-L1 and L1-L2. There is no significant
spondylolisthesis. Endplate osteophytes are noted at L1-2, L2-3, and
L3-4. There is no significant facet joint hypertrophy. The pedicles
and transverse processes are intact. The observed portions of the
sacrum are normal.
IMPRESSION: Mild degenerative disc space narrowing in the upper and mid lumbar
spine. No compression fracture or significant spondylolisthesis.

## 2018-03-15 HISTORY — PX: BICEPS TENDON REPAIR: SHX566

## 2018-03-15 HISTORY — PX: SHOULDER ARTHROSCOPY W/ ROTATOR CUFF REPAIR: SHX2400

## 2018-11-01 ENCOUNTER — Encounter: Payer: Self-pay | Admitting: Podiatry

## 2018-11-01 ENCOUNTER — Other Ambulatory Visit: Payer: Self-pay

## 2018-11-01 ENCOUNTER — Ambulatory Visit (INDEPENDENT_AMBULATORY_CARE_PROVIDER_SITE_OTHER): Payer: BC Managed Care – PPO

## 2018-11-01 ENCOUNTER — Ambulatory Visit (INDEPENDENT_AMBULATORY_CARE_PROVIDER_SITE_OTHER): Payer: BC Managed Care – PPO | Admitting: Podiatry

## 2018-11-01 ENCOUNTER — Other Ambulatory Visit: Payer: Self-pay | Admitting: Podiatry

## 2018-11-01 VITALS — Temp 97.3°F

## 2018-11-01 DIAGNOSIS — M722 Plantar fascial fibromatosis: Secondary | ICD-10-CM

## 2018-11-01 DIAGNOSIS — M79672 Pain in left foot: Secondary | ICD-10-CM

## 2018-11-01 NOTE — Progress Notes (Signed)
Subjective:   Patient ID: Brady Mccormick, male   DOB: 48 y.o.   MRN: [id number]   HPI Patient states he has developed a lot of pain in the medial aspect of his left heel and states is been excruciating for the last month.  Worse when he gets up and after periods of sitting   ROS      Objective:  Physical Exam  Acute plantar fasciitis left with inflammation fluid buildup     Assessment:  Pain is present at the insertion of the tendon into the calcaneus with inflammation fluid noted     Plan:  H&P reviewed x-ray and did sterile prep and injected the fascia left at its insertion 3 mg Kenalog 5 mg Xylocaine advised on supportive shoes continue night splint usage and reappoint 2 weeks to reevaluate  X-ray indicates large spur formation with no indications of stress fracture arthritis

## 2018-11-15 ENCOUNTER — Ambulatory Visit: Payer: BC Managed Care – PPO | Admitting: Podiatry

## 2018-11-22 ENCOUNTER — Encounter: Payer: Self-pay | Admitting: Podiatry

## 2018-11-22 ENCOUNTER — Other Ambulatory Visit: Payer: Self-pay

## 2018-11-22 ENCOUNTER — Ambulatory Visit (INDEPENDENT_AMBULATORY_CARE_PROVIDER_SITE_OTHER): Payer: BC Managed Care – PPO | Admitting: Podiatry

## 2018-11-22 DIAGNOSIS — M722 Plantar fascial fibromatosis: Secondary | ICD-10-CM

## 2018-11-22 MED ORDER — METHYLPREDNISOLONE 4 MG PO TBPK: ORAL_TABLET | ORAL | 0 refills | Status: DC

## 2018-11-22 NOTE — Progress Notes (Signed)
Subjective:   Patient ID: Brady Mccormick, male   DOB: 48 y.o.   MRN: [id number]   HPI Patient presents stating that at this time the pain is really bothering him and that it did not seem to respond to the injection and he cannot bear weight on his heel   ROS      Objective:  Physical Exam  Neurovascular status intact with patient's left heel being severely tender at the medial band insertional point calcaneus with inflammation fluid buildup around the insertional point     Assessment:  Acute plantar fasciitis left with inflammation fluid buildup     Plan:  H&P condition removed sterile prep applied and injected the plantar fascial 3 mg Kenalog 5 mg Xylocaine and applied a air fracture walker in order to completely immobilize the foot and take stress off of it at this time.  Reappoint to recheck

## 2018-12-13 ENCOUNTER — Ambulatory Visit: Payer: BC Managed Care – PPO | Admitting: Podiatry

## 2018-12-13 ENCOUNTER — Encounter: Payer: Self-pay | Admitting: Podiatry

## 2018-12-13 ENCOUNTER — Other Ambulatory Visit: Payer: Self-pay

## 2018-12-13 DIAGNOSIS — M722 Plantar fascial fibromatosis: Secondary | ICD-10-CM | POA: Diagnosis not present

## 2018-12-13 NOTE — Patient Instructions (Signed)
Pre-Operative Instructions  Congratulations, you have decided to take an important step towards improving your quality of life.  You can be assured that the doctors and staff at [facility] will be with you every step of the way.  Here are some important things you should know:  1. Plan to be at the surgery center/hospital at least 1 (one) hour prior to your scheduled time, unless otherwise directed by the surgical center/hospital staff.  You must have a responsible adult accompany you, remain during the surgery and drive you home.  Make sure you have directions to the surgical center/hospital to ensure you arrive on time. 2. If you are having surgery at [facility] or [facility], you will need a copy of your medical history and physical form from your family physician within one month prior to the date of surgery. We will give you a form for your primary physician to complete.  3. We make every effort to accommodate the date you request for surgery.  However, there are times where surgery dates or times have to be moved.  We will contact you as soon as possible if a change in schedule is required.   4. No aspirin/ibuprofen for one week before surgery.  If you are on aspirin, any non-steroidal anti-inflammatory medications (Mobic, Aleve, Ibuprofen) should not be taken seven (7) days prior to your surgery.  You make take Tylenol for pain prior to surgery.  5. Medications - If you are taking daily heart and blood pressure medications, seizure, reflux, allergy, asthma, anxiety, pain or diabetes medications, make sure you notify the surgery center/hospital before the day of surgery so they can tell you which medications you should take or avoid the day of surgery. 6. No food or drink after midnight the night before surgery unless directed otherwise by surgical center/hospital staff. 7. No alcoholic beverages 24-hours prior to surgery.  No smoking 24-hours prior or 24-hours after  surgery. 8. Wear loose pants or shorts. They should be loose enough to fit over bandages, boots, and casts. 9. Don't wear slip-on shoes. Sneakers are preferred. 10. Bring your boot with you to the surgery center/hospital.  Also bring crutches or a walker if your physician has prescribed it for you.  If you do not have this equipment, it will be provided for you after surgery. 11. If you have not been contacted by the surgery center/hospital by the day before your surgery, call to confirm the date and time of your surgery. 12. Leave-time from work may vary depending on the type of surgery you have.  Appropriate arrangements should be made prior to surgery with your employer. 13. Prescriptions will be provided immediately following surgery by your doctor.  Fill these as soon as possible after surgery and take the medication as directed. Pain medications will not be refilled on weekends and must be approved by the doctor. 14. Remove nail polish on the operative foot and avoid getting pedicures prior to surgery. 15. Wash the night before surgery.  The night before surgery wash the foot and leg well with water and the antibacterial soap provided. Be sure to pay special attention to beneath the toenails and in between the toes.  Wash for at least three (3) minutes. Rinse thoroughly with water and dry well with a towel.  Perform this wash unless told not to do so by your physician.  Enclosed: 1 Ice pack (please put in freezer the night before surgery)   1 Hibiclens skin cleaner     Pre-op instructions  If you have any questions regarding the instructions, please do not hesitate to call our office.  [place]: [address], [place], [place] [address] -- [phone]  [place]: [address] [place], [place] [address] -- [phone]  [place]: [address]  [place], [place] [address] -- [phone]   Website: [email or web address] 

## 2018-12-13 NOTE — Progress Notes (Signed)
Subjective:   Patient ID: Brady Mccormick, male   DOB: 48 y.o.   MRN: [id number]   HPI Patient states his heel is still killing him and the boot has only helped when he wears it but the minute he takes it off the pain is severe again and it feels like a sharp spike and is been going on now for approximate 5 to 6 months   ROS      Objective:  Physical Exam  Neurovascular status intact with exquisite discomfort medial fascial band left at the insertional point tendon calcaneus     Assessment:  Acute plantar fasciitis left that is failed to respond so far     Plan:  Reviewed condition at great length and I do think the consideration is therefore surgical intervention and endoscopic release.  I discussed other treatment options and patient has opted for this as the long-term and wants surgery and today I went ahead and reviewed surgery and explained to him what would be required going over all possible complications as outlined and the fact there is no long-term guarantees as far as success with probability or possibility of arch pain or lateral foot pain that can occur with this type procedure.  Patient wants surgery signed consent form understanding recovery can take 6 months and is scheduled for outpatient surgery

## 2018-12-14 ENCOUNTER — Telehealth: Payer: Self-pay | Admitting: *Deleted

## 2018-12-14 NOTE — Telephone Encounter (Signed)
Called and spoke with Eustaquio Maize from [place] and there is no prior authorization or pre-cert required for procedure code [id number] and diagnosis M72.2 and the reference number is [id number]. Lattie Haw

## 2018-12-26 ENCOUNTER — Encounter: Payer: Self-pay | Admitting: Podiatry

## 2018-12-26 ENCOUNTER — Telehealth: Payer: Self-pay | Admitting: *Deleted

## 2018-12-26 DIAGNOSIS — M722 Plantar fascial fibromatosis: Secondary | ICD-10-CM | POA: Diagnosis not present

## 2018-12-26 NOTE — Telephone Encounter (Signed)
[place] states the date on the pt's prescription has been written over with a 3 to make 48, and she wanted to make sure it was okay to fill. I informed Brady Mccormick, pt had surgery today by Dr. Paulla Dolly and it was okay to fill.

## 2018-12-28 ENCOUNTER — Telehealth: Payer: Self-pay | Admitting: *Deleted

## 2018-12-28 NOTE — Telephone Encounter (Signed)
Called patient at [phone] (Cell #) to check to see how they were doing from when they got their surgery from Dr. Paulla Dolly on Tuesday, December 26, 2018.  DOS 12/26/18 Endoscopic Release Med Band LT Heel  Patient stated, "They had a rough day yesterday (12/27/18); Pain is a 3-4/10 when elevating their foot; Pain 7-8/10 when standing; I have had no fever, chills, N/V. I had the hiccups yesterday that lasted for an hour; today I feel better; Pain medication is helping; I understood post op instructions".  Next appointment is on Monday, January 01, 2019 at 8:30 am.

## 2019-01-01 ENCOUNTER — Ambulatory Visit (INDEPENDENT_AMBULATORY_CARE_PROVIDER_SITE_OTHER): Payer: BC Managed Care – PPO | Admitting: Podiatry

## 2019-01-01 ENCOUNTER — Encounter: Payer: Self-pay | Admitting: Podiatry

## 2019-01-01 ENCOUNTER — Other Ambulatory Visit: Payer: Self-pay

## 2019-01-01 DIAGNOSIS — M722 Plantar fascial fibromatosis: Secondary | ICD-10-CM

## 2019-01-01 DIAGNOSIS — Z09 Encounter for follow-up examination after completed treatment for conditions other than malignant neoplasm: Secondary | ICD-10-CM

## 2019-01-15 ENCOUNTER — Ambulatory Visit (INDEPENDENT_AMBULATORY_CARE_PROVIDER_SITE_OTHER): Payer: BC Managed Care – PPO | Admitting: Podiatry

## 2019-01-15 ENCOUNTER — Encounter: Payer: Self-pay | Admitting: Podiatry

## 2019-01-15 ENCOUNTER — Other Ambulatory Visit: Payer: Self-pay

## 2019-01-15 DIAGNOSIS — M722 Plantar fascial fibromatosis: Secondary | ICD-10-CM

## 2019-01-15 NOTE — Progress Notes (Signed)
Subjective:   Patient ID: Brady Mccormick, male   DOB: 48 y.o.   MRN: [id number]   HPI Patient states overall doing pretty well with mild to moderate arch pain but overall very satisfied   ROS      Objective:  Physical Exam  Neurovascular status intact negative Bevelyn Buckles' sign noted with patient's left incision site medial lateral doing well wound edges well coapted stitches in place     Assessment:  Doing well post endoscopic surgery left     Plan:  Stitches removed sterile dressing applied dispensed ankle compression stocking advised on continued boot usage with gradual increase in activity and hopefully return to normal activities within the next 4 to 6 weeks.  Reappoint as needed

## 2019-01-24 MED ORDER — HYDROCODONE-ACETAMINOPHEN 10-325 MG PO TABS: 1.0000 | ORAL_TABLET | Freq: Three times a day (TID) | ORAL | 0 refills | Status: AC | PRN

## 2019-01-24 NOTE — Addendum Note (Signed)
Addended by: Wallene Huh on: 01/24/2019 05:20 PM   Modules accepted: Orders

## 2020-03-15 HISTORY — PX: TOTAL HIP ARTHROPLASTY: SHX124

## 2021-04-16 DIAGNOSIS — M79641 Pain in right hand: Secondary | ICD-10-CM | POA: Diagnosis not present

## 2021-04-16 DIAGNOSIS — M25531 Pain in right wrist: Secondary | ICD-10-CM | POA: Diagnosis not present

## 2021-04-22 DIAGNOSIS — M79641 Pain in right hand: Secondary | ICD-10-CM | POA: Diagnosis not present

## 2021-04-22 DIAGNOSIS — M79644 Pain in right finger(s): Secondary | ICD-10-CM | POA: Diagnosis not present

## 2021-04-22 DIAGNOSIS — M25531 Pain in right wrist: Secondary | ICD-10-CM | POA: Diagnosis not present

## 2021-05-04 DIAGNOSIS — M79641 Pain in right hand: Secondary | ICD-10-CM | POA: Diagnosis not present

## 2021-05-04 DIAGNOSIS — Z9889 Other specified postprocedural states: Secondary | ICD-10-CM | POA: Diagnosis not present

## 2021-05-14 DIAGNOSIS — Z4789 Encounter for other orthopedic aftercare: Secondary | ICD-10-CM | POA: Diagnosis not present

## 2021-05-14 DIAGNOSIS — M79641 Pain in right hand: Secondary | ICD-10-CM | POA: Diagnosis not present

## 2021-05-14 DIAGNOSIS — M6281 Muscle weakness (generalized): Secondary | ICD-10-CM | POA: Diagnosis not present

## 2021-05-20 DIAGNOSIS — Z4789 Encounter for other orthopedic aftercare: Secondary | ICD-10-CM | POA: Diagnosis not present

## 2021-05-20 DIAGNOSIS — M79644 Pain in right finger(s): Secondary | ICD-10-CM | POA: Diagnosis not present

## 2021-05-20 DIAGNOSIS — M25641 Stiffness of right hand, not elsewhere classified: Secondary | ICD-10-CM | POA: Diagnosis not present

## 2021-05-20 DIAGNOSIS — M79641 Pain in right hand: Secondary | ICD-10-CM | POA: Diagnosis not present

## 2021-05-25 DIAGNOSIS — M79641 Pain in right hand: Secondary | ICD-10-CM | POA: Diagnosis not present

## 2021-05-25 DIAGNOSIS — M25641 Stiffness of right hand, not elsewhere classified: Secondary | ICD-10-CM | POA: Diagnosis not present

## 2021-05-25 DIAGNOSIS — Z4789 Encounter for other orthopedic aftercare: Secondary | ICD-10-CM | POA: Diagnosis not present

## 2021-05-25 DIAGNOSIS — M79644 Pain in right finger(s): Secondary | ICD-10-CM | POA: Diagnosis not present

## 2021-06-08 DIAGNOSIS — Z4789 Encounter for other orthopedic aftercare: Secondary | ICD-10-CM | POA: Diagnosis not present

## 2021-06-08 DIAGNOSIS — M79641 Pain in right hand: Secondary | ICD-10-CM | POA: Diagnosis not present

## 2021-06-08 DIAGNOSIS — M79644 Pain in right finger(s): Secondary | ICD-10-CM | POA: Diagnosis not present

## 2021-06-08 DIAGNOSIS — M25641 Stiffness of right hand, not elsewhere classified: Secondary | ICD-10-CM | POA: Diagnosis not present

## 2021-07-01 DIAGNOSIS — R490 Dysphonia: Secondary | ICD-10-CM | POA: Diagnosis not present

## 2021-07-01 DIAGNOSIS — Z6841 Body Mass Index (BMI) 40.0 and over, adult: Secondary | ICD-10-CM | POA: Diagnosis not present

## 2021-07-01 DIAGNOSIS — R0609 Other forms of dyspnea: Secondary | ICD-10-CM | POA: Diagnosis not present

## 2021-07-01 DIAGNOSIS — R062 Wheezing: Secondary | ICD-10-CM | POA: Diagnosis not present

## 2021-07-02 DIAGNOSIS — R062 Wheezing: Secondary | ICD-10-CM | POA: Diagnosis not present

## 2021-07-02 DIAGNOSIS — R0609 Other forms of dyspnea: Secondary | ICD-10-CM | POA: Diagnosis not present

## 2021-07-15 DIAGNOSIS — R0602 Shortness of breath: Secondary | ICD-10-CM | POA: Diagnosis not present

## 2021-07-15 DIAGNOSIS — M79641 Pain in right hand: Secondary | ICD-10-CM | POA: Diagnosis not present

## 2021-07-15 DIAGNOSIS — R072 Precordial pain: Secondary | ICD-10-CM | POA: Diagnosis not present

## 2021-07-15 DIAGNOSIS — R2689 Other abnormalities of gait and mobility: Secondary | ICD-10-CM | POA: Diagnosis not present

## 2021-07-15 DIAGNOSIS — M25511 Pain in right shoulder: Secondary | ICD-10-CM | POA: Diagnosis not present

## 2021-08-05 DIAGNOSIS — R079 Chest pain, unspecified: Secondary | ICD-10-CM | POA: Diagnosis not present

## 2021-08-05 DIAGNOSIS — R0602 Shortness of breath: Secondary | ICD-10-CM | POA: Diagnosis not present

## 2021-08-05 DIAGNOSIS — R072 Precordial pain: Secondary | ICD-10-CM | POA: Diagnosis not present

## 2021-08-14 DIAGNOSIS — R1084 Generalized abdominal pain: Secondary | ICD-10-CM | POA: Diagnosis not present

## 2021-08-14 DIAGNOSIS — N390 Urinary tract infection, site not specified: Secondary | ICD-10-CM | POA: Diagnosis not present

## 2021-08-14 DIAGNOSIS — E291 Testicular hypofunction: Secondary | ICD-10-CM | POA: Diagnosis not present

## 2021-08-14 DIAGNOSIS — R8279 Other abnormal findings on microbiological examination of urine: Secondary | ICD-10-CM | POA: Diagnosis not present

## 2021-08-14 DIAGNOSIS — B957 Other staphylococcus as the cause of diseases classified elsewhere: Secondary | ICD-10-CM | POA: Diagnosis not present

## 2021-08-26 DIAGNOSIS — R0602 Shortness of breath: Secondary | ICD-10-CM | POA: Diagnosis not present

## 2021-08-26 DIAGNOSIS — R072 Precordial pain: Secondary | ICD-10-CM | POA: Diagnosis not present

## 2021-09-07 DIAGNOSIS — G4733 Obstructive sleep apnea (adult) (pediatric): Secondary | ICD-10-CM | POA: Diagnosis not present

## 2021-09-10 DIAGNOSIS — E291 Testicular hypofunction: Secondary | ICD-10-CM | POA: Diagnosis not present

## 2021-09-11 DIAGNOSIS — N5201 Erectile dysfunction due to arterial insufficiency: Secondary | ICD-10-CM | POA: Diagnosis not present

## 2021-09-11 DIAGNOSIS — E291 Testicular hypofunction: Secondary | ICD-10-CM | POA: Diagnosis not present

## 2021-09-11 DIAGNOSIS — B957 Other staphylococcus as the cause of diseases classified elsewhere: Secondary | ICD-10-CM | POA: Diagnosis not present

## 2021-09-11 DIAGNOSIS — N3000 Acute cystitis without hematuria: Secondary | ICD-10-CM | POA: Diagnosis not present

## 2021-09-11 DIAGNOSIS — N390 Urinary tract infection, site not specified: Secondary | ICD-10-CM | POA: Diagnosis not present

## 2021-09-20 DIAGNOSIS — N189 Chronic kidney disease, unspecified: Secondary | ICD-10-CM | POA: Diagnosis not present

## 2021-09-20 DIAGNOSIS — S3992XA Unspecified injury of lower back, initial encounter: Secondary | ICD-10-CM | POA: Diagnosis not present

## 2021-09-20 DIAGNOSIS — T8089XA Other complications following infusion, transfusion and therapeutic injection, initial encounter: Secondary | ICD-10-CM | POA: Diagnosis not present

## 2021-09-20 DIAGNOSIS — Y828 Other medical devices associated with adverse incidents: Secondary | ICD-10-CM | POA: Diagnosis not present

## 2021-09-20 DIAGNOSIS — S31824A Puncture wound with foreign body of left buttock, initial encounter: Secondary | ICD-10-CM | POA: Diagnosis not present

## 2021-09-20 DIAGNOSIS — Z880 Allergy status to penicillin: Secondary | ICD-10-CM | POA: Diagnosis not present

## 2021-09-20 DIAGNOSIS — R030 Elevated blood-pressure reading, without diagnosis of hypertension: Secondary | ICD-10-CM | POA: Diagnosis not present

## 2021-10-19 DIAGNOSIS — G4733 Obstructive sleep apnea (adult) (pediatric): Secondary | ICD-10-CM | POA: Diagnosis not present

## 2022-01-26 DIAGNOSIS — G4733 Obstructive sleep apnea (adult) (pediatric): Secondary | ICD-10-CM | POA: Diagnosis not present

## 2022-01-26 DIAGNOSIS — R0902 Hypoxemia: Secondary | ICD-10-CM | POA: Diagnosis not present

## 2022-01-26 DIAGNOSIS — Z6841 Body Mass Index (BMI) 40.0 and over, adult: Secondary | ICD-10-CM | POA: Diagnosis not present

## 2022-02-19 DIAGNOSIS — M25561 Pain in right knee: Secondary | ICD-10-CM | POA: Diagnosis not present

## 2022-03-01 DIAGNOSIS — M25561 Pain in right knee: Secondary | ICD-10-CM | POA: Diagnosis not present

## 2022-03-03 DIAGNOSIS — M25561 Pain in right knee: Secondary | ICD-10-CM | POA: Diagnosis not present

## 2022-04-15 DIAGNOSIS — M654 Radial styloid tenosynovitis [de Quervain]: Secondary | ICD-10-CM | POA: Diagnosis not present

## 2022-04-15 DIAGNOSIS — G5602 Carpal tunnel syndrome, left upper limb: Secondary | ICD-10-CM | POA: Diagnosis not present

## 2022-04-15 DIAGNOSIS — M25532 Pain in left wrist: Secondary | ICD-10-CM | POA: Diagnosis not present

## 2022-04-22 DIAGNOSIS — S83281A Other tear of lateral meniscus, current injury, right knee, initial encounter: Secondary | ICD-10-CM | POA: Diagnosis not present

## 2022-04-22 DIAGNOSIS — Y999 Unspecified external cause status: Secondary | ICD-10-CM | POA: Diagnosis not present

## 2022-04-22 DIAGNOSIS — G8918 Other acute postprocedural pain: Secondary | ICD-10-CM | POA: Diagnosis not present

## 2022-04-22 DIAGNOSIS — X58XXXA Exposure to other specified factors, initial encounter: Secondary | ICD-10-CM | POA: Diagnosis not present

## 2022-04-22 DIAGNOSIS — S83271A Complex tear of lateral meniscus, current injury, right knee, initial encounter: Secondary | ICD-10-CM | POA: Diagnosis not present

## 2022-04-27 DIAGNOSIS — M6281 Muscle weakness (generalized): Secondary | ICD-10-CM | POA: Diagnosis not present

## 2022-04-27 DIAGNOSIS — R262 Difficulty in walking, not elsewhere classified: Secondary | ICD-10-CM | POA: Diagnosis not present

## 2022-04-27 DIAGNOSIS — M25661 Stiffness of right knee, not elsewhere classified: Secondary | ICD-10-CM | POA: Diagnosis not present

## 2022-04-27 DIAGNOSIS — S83271D Complex tear of lateral meniscus, current injury, right knee, subsequent encounter: Secondary | ICD-10-CM | POA: Diagnosis not present

## 2022-05-03 DIAGNOSIS — M6281 Muscle weakness (generalized): Secondary | ICD-10-CM | POA: Diagnosis not present

## 2022-05-03 DIAGNOSIS — S83271D Complex tear of lateral meniscus, current injury, right knee, subsequent encounter: Secondary | ICD-10-CM | POA: Diagnosis not present

## 2022-05-03 DIAGNOSIS — M25661 Stiffness of right knee, not elsewhere classified: Secondary | ICD-10-CM | POA: Diagnosis not present

## 2022-05-03 DIAGNOSIS — R262 Difficulty in walking, not elsewhere classified: Secondary | ICD-10-CM | POA: Diagnosis not present

## 2022-11-08 NOTE — Progress Notes (Signed)
improving

## 2023-02-15 DIAGNOSIS — J0100 Acute maxillary sinusitis, unspecified: Secondary | ICD-10-CM | POA: Diagnosis not present

## 2023-02-15 DIAGNOSIS — R635 Abnormal weight gain: Secondary | ICD-10-CM | POA: Diagnosis not present

## 2023-02-15 DIAGNOSIS — E291 Testicular hypofunction: Secondary | ICD-10-CM | POA: Diagnosis not present

## 2023-03-11 DIAGNOSIS — R7401 Elevation of levels of liver transaminase levels: Secondary | ICD-10-CM | POA: Diagnosis not present

## 2023-03-26 DIAGNOSIS — N390 Urinary tract infection, site not specified: Secondary | ICD-10-CM | POA: Diagnosis not present

## 2023-03-26 DIAGNOSIS — N200 Calculus of kidney: Secondary | ICD-10-CM | POA: Diagnosis not present

## 2023-03-26 DIAGNOSIS — K529 Noninfective gastroenteritis and colitis, unspecified: Secondary | ICD-10-CM | POA: Diagnosis not present

## 2023-03-26 DIAGNOSIS — Z96641 Presence of right artificial hip joint: Secondary | ICD-10-CM | POA: Diagnosis not present

## 2023-03-26 DIAGNOSIS — K449 Diaphragmatic hernia without obstruction or gangrene: Secondary | ICD-10-CM | POA: Diagnosis not present

## 2023-03-26 DIAGNOSIS — R110 Nausea: Secondary | ICD-10-CM | POA: Diagnosis not present

## 2023-03-26 DIAGNOSIS — R109 Unspecified abdominal pain: Secondary | ICD-10-CM | POA: Diagnosis not present

## 2023-04-20 DIAGNOSIS — J069 Acute upper respiratory infection, unspecified: Secondary | ICD-10-CM | POA: Diagnosis not present

## 2023-05-13 DIAGNOSIS — J0100 Acute maxillary sinusitis, unspecified: Secondary | ICD-10-CM | POA: Diagnosis not present

## 2023-05-13 DIAGNOSIS — R635 Abnormal weight gain: Secondary | ICD-10-CM | POA: Diagnosis not present

## 2023-05-13 DIAGNOSIS — E291 Testicular hypofunction: Secondary | ICD-10-CM | POA: Diagnosis not present

## 2023-06-24 DIAGNOSIS — H6121 Impacted cerumen, right ear: Secondary | ICD-10-CM | POA: Diagnosis not present

## 2023-06-24 DIAGNOSIS — E349 Endocrine disorder, unspecified: Secondary | ICD-10-CM | POA: Diagnosis not present

## 2023-06-24 DIAGNOSIS — R635 Abnormal weight gain: Secondary | ICD-10-CM | POA: Diagnosis not present

## 2023-06-24 DIAGNOSIS — E291 Testicular hypofunction: Secondary | ICD-10-CM | POA: Diagnosis not present

## 2023-06-29 DIAGNOSIS — N132 Hydronephrosis with renal and ureteral calculous obstruction: Secondary | ICD-10-CM | POA: Diagnosis not present

## 2023-06-29 DIAGNOSIS — R6883 Chills (without fever): Secondary | ICD-10-CM | POA: Diagnosis not present

## 2023-06-29 DIAGNOSIS — R110 Nausea: Secondary | ICD-10-CM | POA: Diagnosis not present

## 2023-06-29 DIAGNOSIS — R109 Unspecified abdominal pain: Secondary | ICD-10-CM | POA: Diagnosis not present

## 2023-06-29 DIAGNOSIS — N3000 Acute cystitis without hematuria: Secondary | ICD-10-CM | POA: Diagnosis not present

## 2023-06-29 DIAGNOSIS — N200 Calculus of kidney: Secondary | ICD-10-CM | POA: Diagnosis not present

## 2023-06-29 DIAGNOSIS — N136 Pyonephrosis: Secondary | ICD-10-CM | POA: Diagnosis not present

## 2023-06-30 DIAGNOSIS — Z466 Encounter for fitting and adjustment of urinary device: Secondary | ICD-10-CM | POA: Diagnosis not present

## 2023-06-30 DIAGNOSIS — Z79899 Other long term (current) drug therapy: Secondary | ICD-10-CM | POA: Diagnosis not present

## 2023-06-30 DIAGNOSIS — N201 Calculus of ureter: Secondary | ICD-10-CM | POA: Diagnosis not present

## 2023-06-30 DIAGNOSIS — R82998 Other abnormal findings in urine: Secondary | ICD-10-CM | POA: Diagnosis not present

## 2023-06-30 DIAGNOSIS — Z880 Allergy status to penicillin: Secondary | ICD-10-CM | POA: Diagnosis not present

## 2023-06-30 DIAGNOSIS — G4733 Obstructive sleep apnea (adult) (pediatric): Secondary | ICD-10-CM | POA: Diagnosis not present

## 2023-06-30 DIAGNOSIS — N202 Calculus of kidney with calculus of ureter: Secondary | ICD-10-CM | POA: Diagnosis not present

## 2023-06-30 DIAGNOSIS — Z1330 Encounter for screening examination for mental health and behavioral disorders, unspecified: Secondary | ICD-10-CM | POA: Diagnosis not present

## 2023-06-30 DIAGNOSIS — N200 Calculus of kidney: Secondary | ICD-10-CM | POA: Diagnosis not present

## 2023-06-30 DIAGNOSIS — F32A Depression, unspecified: Secondary | ICD-10-CM | POA: Diagnosis not present

## 2023-06-30 DIAGNOSIS — K219 Gastro-esophageal reflux disease without esophagitis: Secondary | ICD-10-CM | POA: Diagnosis not present

## 2023-06-30 DIAGNOSIS — Z6839 Body mass index (BMI) 39.0-39.9, adult: Secondary | ICD-10-CM | POA: Diagnosis not present

## 2023-07-11 DIAGNOSIS — B957 Other staphylococcus as the cause of diseases classified elsewhere: Secondary | ICD-10-CM | POA: Diagnosis not present

## 2023-07-11 DIAGNOSIS — N390 Urinary tract infection, site not specified: Secondary | ICD-10-CM | POA: Diagnosis not present

## 2023-07-11 DIAGNOSIS — Z87442 Personal history of urinary calculi: Secondary | ICD-10-CM | POA: Diagnosis not present

## 2023-07-11 DIAGNOSIS — N200 Calculus of kidney: Secondary | ICD-10-CM | POA: Diagnosis not present

## 2023-07-11 DIAGNOSIS — N5201 Erectile dysfunction due to arterial insufficiency: Secondary | ICD-10-CM | POA: Diagnosis not present

## 2023-07-14 DIAGNOSIS — N201 Calculus of ureter: Secondary | ICD-10-CM | POA: Diagnosis not present

## 2023-07-21 ENCOUNTER — Other Ambulatory Visit: Payer: Self-pay | Admitting: Urology

## 2023-07-23 NOTE — Progress Notes (Signed)
 COVID Vaccine received:  [x]  No []  Yes Date of any COVID positive Test in last 90 days:  none  PCP -  Jenise Mixer PA-C [phone] (Work)  [phone] (Fax)   Cardiologist -   Chest x-ray - 07-02-2021  2v  Epic EKG -  07-25-2023 Stress Test -  ECHO - 08-26-2021  CEW Cardiac Cath -  CTA calcium score of 0 on 08-05-2021  CEW  Bowel Prep - [x]  No  []   Yes ______  Pacemaker / ICD device [x]  No []  Yes   Spinal Cord Stimulator:[x]  No []  Yes       History of Sleep Apnea? []  No [x]  Yes   CPAP used?- []  No [x]  Yes   Does the patient monitor blood sugar?   [x]  N/A   []  No []  Yes  Patient has: [x]  NO Hx DM   []  Pre-DM   []  DM1  []   DM2  Blood Thinner / Instructions:  None Aspirin Instructions:  none  ERAS Protocol Ordered: [x]  No  []  Yes Patient is to be NPO after:  MN  Dental hx: []  Dentures:  [x]  N/A      []  Bridge or Partial:                   []  Loose or Damaged teeth:   Activity level: Patient is able to climb a flight of stairs without difficulty; [x]  No CP  [x]  No SOB. Patient can perform ADLs without assistance.   Anesthesia review: Depression, hx of Phentermine  use.   Patient denies shortness of breath, fever, cough and chest pain at PAT appointment.  Patient verbalized understanding and agreement to the Pre-Surgical Instructions that were given to them at this PAT appointment. Patient was also educated of the need to review these PAT instructions again prior to his surgery.I reviewed the appropriate phone numbers to call if they have any and questions or concerns.

## 2023-07-23 NOTE — Patient Instructions (Signed)
 SURGICAL WAITING ROOM VISITATION Patients having surgery or a procedure may have no more than 2 support people in the waiting area - these visitors may rotate in the visitor waiting room.   If the patient needs to stay at the hospital during part of their recovery, the visitor guidelines for inpatient rooms apply.  PRE-OP VISITATION  Pre-op nurse will coordinate an appropriate time for 1 support person to accompany the patient in pre-op.  This support person may not rotate.  This visitor will be contacted when the time is appropriate for the visitor to come back in the pre-op area.  Please refer to the [facility] website for the visitor guidelines for Inpatients (after your surgery is over and you are in a regular room).  You are not required to quarantine at this time prior to your surgery. However, you must do this: Hand Hygiene often Do NOT share personal items Notify your provider if you are in close contact with someone who has COVID or you develop fever 100.4 or greater, new onset of sneezing, cough, sore throat, shortness of breath or body aches.  If you test positive for Covid or have been in contact with anyone that has tested positive in the last 10 days please notify you surgeon.    Your procedure is scheduled on:   THURSDAY  Jul 28, 2023  Report to [facility] Main Entrance: [facility] entrance where the [employer] is available.   Report to admitting at:  11:15   AM  Call this number if you have any questions or problems the morning of surgery [phone]  DO NOT EAT OR DRINK ANYTHING AFTER MIDNIGHT THE NIGHT PRIOR TO YOUR SURGERY / PROCEDURE.   FOLLOW  ANY ADDITIONAL PRE OP INSTRUCTIONS YOU RECEIVED FROM YOUR SURGEON'S OFFICE!!!   Oral Hygiene is also important to reduce your risk of infection.        Remember - BRUSH YOUR TEETH THE MORNING OF SURGERY WITH YOUR REGULAR TOOTHPASTE  Do NOT smoke after Midnight the night before surgery.  STOP TAKING all  Vitamins, Herbs and supplements 1 week before your surgery.   Take ONLY these medicines the morning of surgery with A SIP OF WATER:  oxybutynin (Ditropan) ???   If You have been diagnosed with Sleep Apnea - Bring CPAP mask and tubing day of surgery. We will provide you with a CPAP machine on the day of your surgery.                   You may not have any metal on your body including  jewelry, and body piercing  Do not wear  lotions, powders, cologne, or deodorant  Men may shave face and neck.  Contacts, Hearing Aids, dentures or bridgework may not be worn into surgery. DENTURES WILL BE REMOVED PRIOR TO SURGERY PLEASE DO NOT APPLY "Poly grip" OR ADHESIVES!!!  Patients discharged on the day of surgery will not be allowed to drive home.  Someone NEEDS to stay with you for the first 24 hours after anesthesia.  Do not bring your home medications to the hospital. The Pharmacy will dispense medications listed on your medication list to you during your admission in the Hospital.  Please read over the following fact sheets you were given: IF YOU HAVE QUESTIONS ABOUT YOUR PRE-OP INSTRUCTIONS, PLEASE CALL [phone].   [place] - Preparing for Surgery Before surgery, you can play an important role.  Because skin is not sterile, your skin needs to  be as free of germs as possible.  You can reduce the number of germs on your skin by washing with CHG (chlorahexidine gluconate) soap before surgery.  CHG is an antiseptic cleaner which kills germs and bonds with the skin to continue killing germs even after washing. Please DO NOT use if you have an allergy to CHG or antibacterial soaps.  If your skin becomes reddened/irritated stop using the CHG and inform your nurse when you arrive at Short Stay. Do not shave (including legs and underarms) for at least 48 hours prior to the first CHG shower.  You may shave your face/neck.  Please follow these instructions carefully:  1.  Shower with CHG Soap the  night before surgery and the  morning of surgery.  2.  If you choose to wash your hair, wash your hair first as usual with your normal  shampoo.  3.  After you shampoo, rinse your hair and body thoroughly to remove the shampoo.                             4.  Use CHG as you would any other liquid soap.  You can apply chg directly to the skin and wash.  Gently with a scrungie or clean washcloth.  5.  Apply the CHG Soap to your body ONLY FROM THE NECK DOWN.   Do not use on face/ open                           Wound or open sores. Avoid contact with eyes, ears mouth and genitals (private parts).                       Wash face,  Genitals (private parts) with your normal soap.             6.  Wash thoroughly, paying special attention to the area where your  surgery  will be performed.  7.  Thoroughly rinse your body with warm water from the neck down.  8.  DO NOT shower/wash with your normal soap after using and rinsing off the CHG Soap.            9.  Pat yourself dry with a clean towel.            10.  Wear clean pajamas.            11.  Place clean sheets on your bed the night of your first shower and do not  sleep with pets.  ON THE DAY OF SURGERY : Do not apply any lotions/deodorants the morning of surgery.  Please wear clean clothes to the hospital/surgery center.    FAILURE TO FOLLOW THESE INSTRUCTIONS MAY RESULT IN THE CANCELLATION OF YOUR SURGERY  PATIENT SIGNATURE_________________________________  NURSE SIGNATURE__________________________________  ________________________________________________________________________

## 2023-07-25 ENCOUNTER — Encounter (HOSPITAL_COMMUNITY)
Admission: RE | Admit: 2023-07-25 | Discharge: 2023-07-25 | Disposition: A | Discharge status: Home / Self Care | Source: Ambulatory Visit | Attending: Urology | Admitting: Urology

## 2023-07-25 ENCOUNTER — Encounter (HOSPITAL_COMMUNITY): Payer: Self-pay

## 2023-07-25 ENCOUNTER — Other Ambulatory Visit: Payer: Self-pay

## 2023-07-25 VITALS — BP 123/80 | HR 84 | Temp 98.7°F | Resp 20 | Ht 71.0 in | Wt 267.0 lb

## 2023-07-25 DIAGNOSIS — Z01818 Encounter for other preprocedural examination: Secondary | ICD-10-CM

## 2023-07-25 DIAGNOSIS — N202 Calculus of kidney with calculus of ureter: Secondary | ICD-10-CM | POA: Diagnosis not present

## 2023-07-25 DIAGNOSIS — N5201 Erectile dysfunction due to arterial insufficiency: Secondary | ICD-10-CM | POA: Diagnosis not present

## 2023-07-25 DIAGNOSIS — T505X5S Adverse effect of appetite depressants, sequela: Secondary | ICD-10-CM | POA: Insufficient documentation

## 2023-07-25 DIAGNOSIS — K219 Gastro-esophageal reflux disease without esophagitis: Secondary | ICD-10-CM | POA: Diagnosis not present

## 2023-07-25 DIAGNOSIS — G4730 Sleep apnea, unspecified: Secondary | ICD-10-CM | POA: Diagnosis not present

## 2023-07-25 DIAGNOSIS — M1990 Unspecified osteoarthritis, unspecified site: Secondary | ICD-10-CM | POA: Diagnosis not present

## 2023-07-25 HISTORY — DX: Unspecified osteoarthritis, unspecified site: M19.90

## 2023-07-25 HISTORY — DX: Sleep apnea, unspecified: G47.30

## 2023-07-25 HISTORY — DX: Depression, unspecified: F32.A

## 2023-07-25 HISTORY — DX: Personal history of urinary calculi: Z87.442

## 2023-07-25 HISTORY — DX: Chronic kidney disease, unspecified: N18.9

## 2023-07-25 HISTORY — DX: Gastro-esophageal reflux disease without esophagitis: K21.9

## 2023-07-25 LAB — COMPREHENSIVE METABOLIC PANEL WITH GFR
ALT: 46 U/L — ABNORMAL HIGH (ref 0–44)
AST: 29 U/L (ref 15–41)
Albumin: 4.1 g/dL (ref 3.5–5.0)
Alkaline Phosphatase: 59 U/L (ref 38–126)
Anion gap: 10 (ref 5–15)
BUN: 14 mg/dL (ref 6–20)
CO2: 22 mmol/L (ref 22–32)
Calcium: 9.4 mg/dL (ref 8.9–10.3)
Chloride: 107 mmol/L (ref 98–111)
Creatinine, Ser: 1 mg/dL (ref 0.61–1.24)
GFR, Estimated: >60 mL/min (ref >60)
Glucose, Bld: 101 mg/dL — ABNORMAL HIGH (ref 70–99)
Potassium: 4.3 mmol/L (ref 3.5–5.1)
Sodium: 139 mmol/L (ref 135–145)
Total Bilirubin: 0.8 mg/dL (ref 0.0–1.2)
Total Protein: 7.7 g/dL (ref 6.5–8.1)

## 2023-07-25 LAB — CBC
HCT: 47.2 % (ref 39.0–52.0)
Hemoglobin: 16.1 g/dL (ref 13.0–17.0)
MCH: 30.3 pg (ref 26.0–34.0)
MCHC: 34.1 g/dL (ref 30.0–36.0)
MCV: 88.7 fL (ref 80.0–100.0)
Platelets: 241 10*3/uL (ref 150–400)
RBC: 5.32 MIL/uL (ref 4.22–5.81)
RDW: 12.5 % (ref 11.5–15.5)
WBC: 4.7 10*3/uL (ref 4.0–10.5)
nRBC: 0 % (ref 0.0–0.2)

## 2023-07-27 NOTE — H&P (Incomplete)
 53 year old male with a history of kidney stones. Per OSH records patient had a large kidney stone was stented offered PCNL patient refused. Also has history of ED.   PMH: Hypogonadism, no Cards hx, No Pulm, no blood thinners.  PSH: Orthopedic surgeries, ESWL x3.   Nephrolithiasis:  07/11/2023: stented on 4/17, R renal pelvis stone 2cm, R ureteral stone, pt is still on flomax. Treated with Keflex.   ED:  07/11/23: not addressed   Low T:  07/11/23: on T injections, has been off these for 1.5 months  07/28/23: patient presents for URS today     ALLERGIES: penicillin    MEDICATIONS: Fluticasone Propionate  Sildenafil Citrate 20 MG Tablet Take 1-5 tabs po daily prn ED  Testosterone  Cypionate 200 MG/ML Solution Testosterone  cypionate 200mg /cc strength,Sig: Inject 0.75 cc(150mg ) IM weekly as directed 10 cc vial     GU PSH: Vasectomy     NON-GU PSH: Carpal tunnel surgery Hip Replacement Rotator cuff surgery     GU PMH: Acute Cystitis/UTI - 09/11/2021 ED due to arterial insufficiency - 09/11/2021, - 2022 Primary hypogonadism - 09/11/2021, - 08/14/2021, - 2022 Flank Pain - 08/14/2021 History of urolithiasis - 08/14/2021    NON-GU PMH: Pyuria/other UA findings - 08/14/2021    FAMILY HISTORY: Cancer - Mother, Father Diabetes - Father   SOCIAL HISTORY: Marital Status: Married Preferred Language: English; Ethnicity: Not Hispanic Or Latino; Race: White Current Smoking Status: Patient has never smoked.   Tobacco Use Assessment Completed: Used Tobacco in last 30 days? Does not use smokeless tobacco. Does not use drugs. Drinks 2 caffeinated drinks per day. Has not had a blood transfusion.    VITAL SIGNS: None   Complexity of Data:  Source Of History:  Patient  Records Review:   Previous Patient Records  Urine Test Review:   Urinalysis   11/03/20  PSA  Total PSA 0.86 ng/mL    09/10/21 02/09/21 02/04/21 11/03/20  Hormones  Testosterone , Total 436.1 ng/dL 469.6 ng/dL 295.2 ng/dL  841.3 ng/dL    PROCEDURES:          Visit Complexity - G2211          Urinalysis w/Scope Dipstick Dipstick Cont'd Micro  Color: Yellow Bilirubin: Neg mg/dL WBC/hpf: 20 - 24/MWN  Appearance: Cloudy Ketones: Neg mg/dL RBC/hpf: 10 - 02/VOZ  Specific Gravity: 1.020 Blood: 3+ ery/uL Bacteria: Mod (26-50/hpf)  pH: 6.0 Protein: Trace mg/dL Cystals: Amorph Phosphates  Glucose: Neg mg/dL Urobilinogen: 0.2 mg/dL Casts: Hyaline    Nitrites: Positive Trichomonas: Not Present    Leukocyte Esterase: 3+ leu/uL Mucous: Not Present      Epithelial Cells: 0 - 5/hpf      Yeast: NS (Not Seen)      Sperm: Not Present    ASSESSMENT:      ICD-10 Details  1 GU:   ED due to arterial insufficiency - N52.01   2   History of urolithiasis - Z87.442   3   Renal calculus - N20.0      PLAN:            Medications New Meds: Bactrim DS 800-160 MG Tablet 1 tablet PO BID   #20  0 Refill(s)  Ketorolac  Tromethamine  10 MG Tablet 1 tablet PO Q 8 H   #10  0 Refill(s)  Hyoscyamine Sulfate 0.125 MG Tablet 1 tablet PO Q 6 H As Directed for bladder spasm  #120  1 Refill(s)  Pharmacy Name:  [facility]  Address:  [address]   [place], [place] [id number]  Phone:  [phone]  Fax:  [phone]            Orders Labs BMP, CBC with Diff, Urine Culture          Document Letter(s):  Created for Patient: Clinical Summary         Notes:   Nephrolithiasis: Patient has a right renal pelvis stone 2 cm in size and a right ureteral stone status post stent at [facility]. Patient is very eager to be stent free recommended PCNL as best option to remove stone. His images are being forwarded from [facility] will review these prior to surgery. Pateint offered PCNL but could not get time for surgery wants staged ureteroscopy now.   Plan for URS today   UCX positive sent bactrim       Pt called cannot find time fo rPCNL will do Staged URS.   We discussed the risk benefits and alternatives  to ureteroscopy. This includes bleeding, infection, damage to surrounding structures including the urethra, bladder, ureter, and kidney. With these possible injuries resulting in need for intervention in the future. We discussed inability to remove all the stone and requiring follow-up ureteroscopy. We also discussed the possibility of not being able to gain access to the kidney and the need for nephrostomy tube. Possibility of long-term stent was also discussed. The patient voiced their understanding and would like to proceed.

## 2023-07-28 ENCOUNTER — Ambulatory Visit (HOSPITAL_COMMUNITY): Payer: Self-pay | Admitting: Anesthesiology

## 2023-07-28 ENCOUNTER — Encounter (HOSPITAL_COMMUNITY)
Admission: RE | Disposition: A | Discharge status: Home / Self Care | Payer: Self-pay | Source: Ambulatory Visit | Attending: Urology

## 2023-07-28 ENCOUNTER — Ambulatory Visit (HOSPITAL_COMMUNITY)
Admission: RE | Admit: 2023-07-28 | Discharge: 2023-07-28 | Disposition: A | Discharge status: Home / Self Care | Source: Ambulatory Visit | Attending: Urology | Admitting: Urology

## 2023-07-28 ENCOUNTER — Ambulatory Visit (HOSPITAL_COMMUNITY)

## 2023-07-28 ENCOUNTER — Encounter (HOSPITAL_COMMUNITY): Payer: Self-pay | Admitting: Urology

## 2023-07-28 DIAGNOSIS — K219 Gastro-esophageal reflux disease without esophagitis: Secondary | ICD-10-CM | POA: Insufficient documentation

## 2023-07-28 DIAGNOSIS — G4730 Sleep apnea, unspecified: Secondary | ICD-10-CM | POA: Diagnosis not present

## 2023-07-28 DIAGNOSIS — M1990 Unspecified osteoarthritis, unspecified site: Secondary | ICD-10-CM | POA: Diagnosis not present

## 2023-07-28 DIAGNOSIS — N200 Calculus of kidney: Secondary | ICD-10-CM | POA: Diagnosis not present

## 2023-07-28 DIAGNOSIS — N201 Calculus of ureter: Secondary | ICD-10-CM | POA: Diagnosis not present

## 2023-07-28 DIAGNOSIS — N202 Calculus of kidney with calculus of ureter: Secondary | ICD-10-CM | POA: Insufficient documentation

## 2023-07-28 DIAGNOSIS — N5201 Erectile dysfunction due to arterial insufficiency: Secondary | ICD-10-CM | POA: Diagnosis not present

## 2023-07-28 HISTORY — PX: CYSTOSCOPY/URETEROSCOPY/HOLMIUM LASER/STENT PLACEMENT: SHX6546

## 2023-07-28 SURGERY — CYSTOSCOPY/URETEROSCOPY/HOLMIUM LASER/STENT PLACEMENT
Anesthesia: General | Laterality: Right

## 2023-07-28 MED ORDER — GENTAMICIN SULFATE 40 MG/ML IJ SOLN: 5.0000 mg/kg | INTRAVENOUS | Status: DC

## 2023-07-28 MED ORDER — ONDANSETRON HCL 4 MG/2ML IJ SOLN
INTRAMUSCULAR | Status: AC
  Filled 2023-07-28: qty 2

## 2023-07-28 MED ORDER — VANCOMYCIN HCL 1500 MG/300ML IV SOLN
1500.0000 mg | INTRAVENOUS | Status: AC
  Administered 2023-07-28: 1500 mg via INTRAVENOUS
  Filled 2023-07-28: qty 300

## 2023-07-28 MED ORDER — FENTANYL CITRATE (PF) 100 MCG/2ML IJ SOLN
INTRAMUSCULAR | Status: AC
  Filled 2023-07-28: qty 2

## 2023-07-28 MED ORDER — LACTATED RINGERS IV SOLN: INTRAVENOUS | Status: DC

## 2023-07-28 MED ORDER — DEXMEDETOMIDINE HCL IN NACL 80 MCG/20ML IV SOLN
INTRAVENOUS | Status: DC | PRN
  Administered 2023-07-28 (×2): 8 ug via INTRAVENOUS

## 2023-07-28 MED ORDER — PROPOFOL 10 MG/ML IV BOLUS
INTRAVENOUS | Status: DC | PRN
  Administered 2023-07-28: 200 mg via INTRAVENOUS

## 2023-07-28 MED ORDER — DEXMEDETOMIDINE HCL IN NACL 80 MCG/20ML IV SOLN
INTRAVENOUS | Status: AC
  Filled 2023-07-28: qty 20

## 2023-07-28 MED ORDER — ACETAMINOPHEN 10 MG/ML IV SOLN: 1000.0000 mg | Freq: Once | INTRAVENOUS | Status: DC | PRN

## 2023-07-28 MED ORDER — VANCOMYCIN HCL IN DEXTROSE 1-5 GM/200ML-% IV SOLN: 1000.0000 mg | INTRAVENOUS | Status: DC

## 2023-07-28 MED ORDER — ROCURONIUM BROMIDE 10 MG/ML (PF) SYRINGE
PREFILLED_SYRINGE | INTRAVENOUS | Status: AC
  Filled 2023-07-28: qty 10

## 2023-07-28 MED ORDER — DEXAMETHASONE SODIUM PHOSPHATE 10 MG/ML IJ SOLN
INTRAMUSCULAR | Status: AC
  Filled 2023-07-28: qty 1

## 2023-07-28 MED ORDER — FENTANYL CITRATE PF 50 MCG/ML IJ SOSY
PREFILLED_SYRINGE | INTRAMUSCULAR | Status: AC
  Filled 2023-07-28: qty 1

## 2023-07-28 MED ORDER — TAMSULOSIN HCL 0.4 MG PO CAPS: 0.4000 mg | ORAL_CAPSULE | Freq: Every day | ORAL | 0 refills | Status: AC

## 2023-07-28 MED ORDER — LIDOCAINE 2% (20 MG/ML) 5 ML SYRINGE
INTRAMUSCULAR | Status: DC | PRN
  Administered 2023-07-28: 60 mg via INTRAVENOUS

## 2023-07-28 MED ORDER — MIDAZOLAM HCL 2 MG/2ML IJ SOLN
INTRAMUSCULAR | Status: AC
  Filled 2023-07-28: qty 2

## 2023-07-28 MED ORDER — DEXAMETHASONE SODIUM PHOSPHATE 10 MG/ML IJ SOLN
INTRAMUSCULAR | Status: DC | PRN
  Administered 2023-07-28: 10 mg via INTRAVENOUS

## 2023-07-28 MED ORDER — AMISULPRIDE (ANTIEMETIC) 5 MG/2ML IV SOLN: 10.0000 mg | Freq: Once | INTRAVENOUS | Status: DC | PRN

## 2023-07-28 MED ORDER — LIDOCAINE HCL (PF) 2 % IJ SOLN
INTRAMUSCULAR | Status: AC
  Filled 2023-07-28: qty 5

## 2023-07-28 MED ORDER — IOHEXOL 300 MG/ML  SOLN
INTRAMUSCULAR | Status: DC | PRN
  Administered 2023-07-28: 5 mL

## 2023-07-28 MED ORDER — 0.9 % SODIUM CHLORIDE (POUR BTL) OPTIME
TOPICAL | Status: DC | PRN
  Administered 2023-07-28: 1000 mL

## 2023-07-28 MED ORDER — FENTANYL CITRATE (PF) 100 MCG/2ML IJ SOLN
INTRAMUSCULAR | Status: DC | PRN
  Administered 2023-07-28: 50 ug via INTRAVENOUS
  Administered 2023-07-28 (×2): 25 ug via INTRAVENOUS
  Administered 2023-07-28: 50 ug via INTRAVENOUS

## 2023-07-28 MED ORDER — MIDAZOLAM HCL 5 MG/5ML IJ SOLN
INTRAMUSCULAR | Status: DC | PRN
  Administered 2023-07-28: 2 mg via INTRAVENOUS

## 2023-07-28 MED ORDER — PROPOFOL 10 MG/ML IV BOLUS
INTRAVENOUS | Status: AC
  Filled 2023-07-28: qty 20

## 2023-07-28 MED ORDER — PHENAZOPYRIDINE HCL 200 MG PO TABS: 200.0000 mg | ORAL_TABLET | Freq: Three times a day (TID) | ORAL | 0 refills | Status: AC | PRN

## 2023-07-28 MED ORDER — METHOCARBAMOL 750 MG PO TABS: 750.0000 mg | ORAL_TABLET | Freq: Four times a day (QID) | ORAL | 0 refills | Status: AC

## 2023-07-28 MED ORDER — OXYCODONE HCL 5 MG PO TABS: 5.0000 mg | ORAL_TABLET | Freq: Once | ORAL | Status: DC | PRN

## 2023-07-28 MED ORDER — SODIUM CHLORIDE 0.9 % IR SOLN
Status: DC | PRN
  Administered 2023-07-28: 3000 mL via INTRAVESICAL

## 2023-07-28 MED ORDER — ONDANSETRON HCL 4 MG/2ML IJ SOLN
INTRAMUSCULAR | Status: DC | PRN
  Administered 2023-07-28: 4 mg via INTRAVENOUS

## 2023-07-28 MED ORDER — HYOSCYAMINE SULFATE 0.125 MG PO TBDP: 0.1250 mg | ORAL_TABLET | Freq: Four times a day (QID) | ORAL | 0 refills | Status: AC | PRN

## 2023-07-28 MED ORDER — CHLORHEXIDINE GLUCONATE 0.12 % MT SOLN
15.0000 mL | Freq: Once | OROMUCOSAL | Status: AC
  Administered 2023-07-28: 15 mL via OROMUCOSAL

## 2023-07-28 MED ORDER — LIDOCAINE HCL (PF) 2 % IJ SOLN
INTRAMUSCULAR | Status: AC
  Filled 2023-07-28: qty 10

## 2023-07-28 MED ORDER — KETOROLAC TROMETHAMINE 30 MG/ML IJ SOLN: 30.0000 mg | Freq: Once | INTRAMUSCULAR | Status: DC | PRN

## 2023-07-28 MED ORDER — OXYCODONE HCL 5 MG/5ML PO SOLN: 5.0000 mg | Freq: Once | ORAL | Status: DC | PRN

## 2023-07-28 MED ORDER — ORAL CARE MOUTH RINSE: 15.0000 mL | Freq: Once | OROMUCOSAL | Status: AC

## 2023-07-28 MED ORDER — GENTAMICIN SULFATE 40 MG/ML IJ SOLN
5.0000 mg/kg | INTRAVENOUS | Status: AC
Start: 2023-07-28 — End: 2023-07-28
  Administered 2023-07-28: 468 mg via INTRAVENOUS
  Filled 2023-07-28: qty 11.75

## 2023-07-28 MED ORDER — FENTANYL CITRATE PF 50 MCG/ML IJ SOSY
25.0000 ug | PREFILLED_SYRINGE | INTRAMUSCULAR | Status: DC | PRN
  Administered 2023-07-28: 50 ug via INTRAVENOUS

## 2023-07-28 SURGICAL SUPPLY — 21 items
BAG URO CATCHER STRL LF (MISCELLANEOUS) ×1 IMPLANT
BASKET ZERO TIP NITINOL 2.4FR (BASKET) IMPLANT
CATH URETERAL DUAL LUMEN 10F (MISCELLANEOUS) IMPLANT
CATH URETL OPEN 5X70 (CATHETERS) ×1 IMPLANT
CLOTH BEACON ORANGE TIMEOUT ST (SAFETY) ×1 IMPLANT
EXTRACTOR STONE 1.7FRX115CM (UROLOGICAL SUPPLIES) IMPLANT
FIBER LASER MOSES 200 DFL (Laser) IMPLANT
FIBER LASER MOSES 365 DFL (Laser) IMPLANT
GLOVE BIO SURGEON STRL SZ8 (GLOVE) ×1 IMPLANT
GOWN STRL REUS W/ TWL XL LVL3 (GOWN DISPOSABLE) ×1 IMPLANT
GUIDEWIRE ANG ZIPWIRE 038X150 (WIRE) IMPLANT
GUIDEWIRE STR DUAL SENSOR (WIRE) ×1 IMPLANT
KIT TURNOVER KIT A (KITS) IMPLANT
MANIFOLD NEPTUNE II (INSTRUMENTS) ×1 IMPLANT
PACK CYSTO (CUSTOM PROCEDURE TRAY) ×1 IMPLANT
SHEATH NAVIGATOR HD 11/13X36 (SHEATH) ×1 IMPLANT
SHEATH NAVIGATOR HD 12/14X28 (SHEATH) IMPLANT
SHEATH NAVIGATOR HD 12/14X36 (SHEATH) IMPLANT
STENT URET 6FRX26 CONTOUR (STENTS) IMPLANT
TUBING CONNECTING 10 (TUBING) ×1 IMPLANT
TUBING UROLOGY SET (TUBING) ×1 IMPLANT

## 2023-07-28 NOTE — Discharge Instructions (Signed)
 DISCHARGE INSTRUCTIONS FOR Ureteroscopy and/or Ureteral Stent Placement  MEDICATIONS:  1.  Robaxin 2. Tamsulosin  3. Hyoscyamine  4. Pyridium  ACTIVITY:  1. No strenuous activity x 1week  2. No driving while on narcotic pain medications  3. Drink plenty of water  4. Continue to walk at home - it is normal to see blood in the urine while the stent is in place, so keep active, but don't over do it.  5. May return to work/school tomorrow or when you feel ready  6. You may experience some pain when urinating in the kidney on the side that was operated on while the stent is in place this is normal  WHAT IS NORMAL TO EXPERIENCE: It is normal to feel the urge to urinate while the stent is in place It is normal to have blood in your urine while the stent is in place  It sometimes can be normal to have pain in your kidney when you urinate   BATHING:  1. You can shower and we recommend daily showers  2. You have a string coming from your urethra: The stent string is attached to your ureteral stent. Do not pull on this until instructed.   DIET: You may return to your normal diet immediately. Because of the raw surface of your bladder, alcohol, spicy foods, acid type foods and drinks with caffeine may cause irritation or frequency and should be used in moderation. To keep your urine flowing freely and to avoid constipation, drink plenty of fluids during the day ( 8-10 glasses ). Tip: Avoid cranberry juice because it is very acidic.  SIGNS/SYMPTOMS TO CALL:  Please call us  if you have a fever greater than 101.5, uncontrolled nausea/vomiting, uncontrolled pain, dizziness, unable to urinate, bloody urine with clots greater than the size of a quarter, chest pain, shortness of breath, leg swelling, leg pain, redness around wound, drainage from wound, or any other concerns or questions.   You can reach us  at [phone].   FOLLOW-UP:  1. You may remove your stent in on Monday 5/19. To do this go  into the shower, grab hold of the tether coming from your urethra. Pull the tether consistent motion until the stent is removed from your body. The stent will be around 10 inches long with a curl on either end.  2. You you have been set up for f/u in 6-8 weeks

## 2023-07-28 NOTE — Anesthesia Procedure Notes (Signed)
 Procedure Name: LMA Insertion Date/Time: 07/28/2023 2:00 PM  Performed by: Erleen Hawking, CRNAPre-anesthesia Checklist: Patient identified, Emergency Drugs available, Suction available, Patient being monitored and Timeout performed Patient Re-evaluated:Patient Re-evaluated prior to induction Oxygen Delivery Method: Circle system utilized Preoxygenation: Pre-oxygenation with 100% oxygen Induction Type: IV induction Ventilation: Mask ventilation without difficulty LMA: LMA inserted LMA Size: 5.0 Tube size: 4.0 mm Number of attempts: 1 Placement Confirmation: positive ETCO2 and breath sounds checked- equal and bilateral Tube secured with: Tape Dental Injury: Teeth and Oropharynx as per pre-operative assessment

## 2023-07-28 NOTE — Op Note (Signed)
 Preoperative diagnosis: right ureteral calculus  Postoperative diagnosis: right ureteral calculus  Procedure:  Cystoscopy right ureteroscopy and stone removal Ureteroscopic laser lithotripsy right 60F x 26 ureteral stent placement  right retrograde pyelography with interpretation  Surgeon: Aimee Alf MD.  Anesthesia: General  Complications: None  Intraoperative findings:  2 cm over the right renal pelvis stone All stone dusted to fragments less than 2 mm in size. Retrograde pyelogram demonstrated no renal perforation. 6 x 26 double-J stent with strings placed at the end the case  Right retrograde pyelogram interpretation: Large renal stone within the pelvis no significant hydronephrosis.  No filling defects within the calyces.  EBL: Minimal  Specimens: None Disposition of specimens: [facility] for stone analysis  Indication: Brady Mccormick is a 53 y.o.   patient with a right ureteral stone and right renal pelvis stone and obstructive pyelonephritis and was stented at outside hospital he was initially scheduled for PCNL refused to do duration until procedure can be done with prefer to do staged ureteroscopy.. After reviewing the management options for treatment, the patient elected to proceed with the above surgical procedure(s). We have discussed the potential benefits and risks of the procedure, side effects of the proposed treatment, the likelihood of the patient achieving the goals of the procedure, and any potential problems that might occur during the procedure or recuperation. Informed consent has been obtained.   Description of procedure:  The patient was taken to the operating room and general anesthesia was induced.  The patient was placed in the dorsal lithotomy position, prepped and draped in the usual sterile fashion, and preoperative antibiotics were administered. A preoperative time-out was performed.   Cystourethroscopy was performed.  The  patient's urethra was examined and was normal.  Prostatic urethra was moderately obstructing with a very high bladder neck.  Pan cystoscopy was then performed. There was no evidence for any bladder tumors, stones, or other mucosal pathology.    Attention then turned to the right ureteral orifice  A 0.38 sensor guidewire was then advanced up the right ureter into the renal pelvis under fluoroscopic guidance. A dual lumen catheter was then placed over the sensor wire into the ureter.  A retrograde pyelogram was then performed through the dual-lumen catheter.  A second sensor wire was then inserted in to the collecting system. Placement was confirmed by fluoro. The dual lumen catheter was then removed and a ureteral access sheath size 35cm 12-14 [place] was inserted over the sensor wire under fluoro to confirm proper placement at the ureteropelvic junction. The obturator and sensor wire were then removed, leaving the other wire on the outside of the sheath as a safety wire.   A flexible ureteroscopy was then advanced through the sheath and into the collecting system. Pan pyeloscopy was then performed.    The stone was then fragmented with a 365 micron holmium laser fiber on a setting of 0.6 and frequency of 8 Hz.      All stones were fragmented to sizes less than 2mm in diameter.   Pan pyeloscopy was then performed and no stones greater than 2mm were visualized.   The sheath was then removed leaving the safety wire in place. Visualization of the ureter on withdrawal of the sheath showed no injury to the ureter.   Leaving the wire in place the bladder was then drained.  A 6 x 26 double-J stent with strings was placed over the wire under fluoroscopic guidance.  Proper curl within the kidney and bladder  was confirmed with fluoroscopy.  Disposition: The tether of the stent was left on and secured to the ventral aspect of the patient's penis. The patient has been scheduled for followup in 6 weeks with a  renal ultrasound.  Patient was initially scheduled for second stage ureteroscopy this week canceled will follow-up with ultrasound in the near future.

## 2023-07-28 NOTE — Anesthesia Preprocedure Evaluation (Addendum)
 Anesthesia Evaluation  Patient identified by MRN, date of birth, ID band Patient awake    Reviewed: Allergy & Precautions, NPO status , Patient's Chart, lab work & pertinent test results  Airway Mallampati: II  TM Distance: >3 FB Neck ROM: Full    Dental no notable dental hx.    Pulmonary sleep apnea and Continuous Positive Airway Pressure Ventilation    Pulmonary exam normal        Cardiovascular negative cardio ROS Normal cardiovascular exam     Neuro/Psych  PSYCHIATRIC DISORDERS  Depression    negative neurological ROS     GI/Hepatic Neg liver ROS,GERD  Controlled,,  Endo/Other  negative endocrine ROS    Renal/GU Renal disease     Musculoskeletal  (+) Arthritis ,    Abdominal  (+) + obese  Peds  Hematology negative hematology ROS (+)   Anesthesia Other Findings RIGHT RENAL STONE  Reproductive/Obstetrics                             Anesthesia Physical Anesthesia Plan  ASA: 2  Anesthesia Plan: General   Post-op Pain Management:    Induction: Intravenous  PONV Risk Score and Plan: 2 and Ondansetron, Dexamethasone, Midazolam and Treatment may vary due to age or medical condition  Airway Management Planned: LMA  Additional Equipment:   Intra-op Plan:   Post-operative Plan: Extubation in OR  Informed Consent: I have reviewed the patients History and Physical, chart, labs and discussed the procedure including the risks, benefits and alternatives for the proposed anesthesia with the patient or authorized representative who has indicated his/her understanding and acceptance.     Dental advisory given  Plan Discussed with: CRNA  Anesthesia Plan Comments:        Anesthesia Quick Evaluation

## 2023-07-28 NOTE — Transfer of Care (Signed)
 Immediate Anesthesia Transfer of Care Note  Patient: Brady Mccormick  Procedure(s) Performed: Procedure(s): CYSTOSCOPY/URETEROSCOPY/HOLMIUM LASER/STENT PLACEMENT (Right)  Patient Location: PACU  Anesthesia Type:General  Level of Consciousness:  sedated, patient cooperative and responds to stimulation  Airway & Oxygen Therapy:Patient Spontanous Breathing and Patient connected to face mask oxgen  Post-op Assessment:  Report given to PACU RN and Post -op Vital signs reviewed and stable  Post vital signs:  Reviewed and stable  Last Vitals:  Vitals:   07/28/23 1442 07/28/23 1445  BP: (!) 128/90 120/80  Pulse: 93 85  Resp: 19 16  Temp: 36.6 C   SpO2: 97% 98%    Complications: No apparent anesthesia complications

## 2023-07-29 ENCOUNTER — Encounter (HOSPITAL_COMMUNITY): Payer: Self-pay | Admitting: Urology

## 2023-07-29 NOTE — Anesthesia Postprocedure Evaluation (Signed)
 Anesthesia Post Note  Patient: Brady Mccormick  Procedure(s) Performed: CYSTOSCOPY/URETEROSCOPY/HOLMIUM LASER/STENT PLACEMENT (Right)     Patient location during evaluation: PACU Anesthesia Type: General Level of consciousness: awake Pain management: pain level controlled Vital Signs Assessment: post-procedure vital signs reviewed and stable Respiratory status: spontaneous breathing, nonlabored ventilation and respiratory function stable Cardiovascular status: blood pressure returned to baseline and stable Postop Assessment: no apparent nausea or vomiting Anesthetic complications: no   No notable events documented.  Last Vitals:  Vitals:   07/28/23 1545 07/28/23 1559  BP: 139/83 127/82  Pulse: 87 75  Resp: 11 20  Temp: (!) 36.3 C 36.7 C  SpO2: 93% 96%    Last Pain:  Vitals:   07/28/23 1559  TempSrc: Oral  PainSc: 3                  Angelize Ryce P Omair Dettmer

## 2023-08-03 DIAGNOSIS — R8271 Bacteriuria: Secondary | ICD-10-CM | POA: Diagnosis not present

## 2023-08-15 ENCOUNTER — Encounter (HOSPITAL_COMMUNITY)

## 2023-08-25 ENCOUNTER — Ambulatory Visit (HOSPITAL_COMMUNITY): Admit: 2023-08-25 | Admitting: Urology

## 2023-08-25 SURGERY — CYSTOSCOPY/URETEROSCOPY/HOLMIUM LASER/STENT PLACEMENT
Anesthesia: General | Laterality: Right

## 2023-09-20 DIAGNOSIS — N5201 Erectile dysfunction due to arterial insufficiency: Secondary | ICD-10-CM | POA: Diagnosis not present

## 2023-09-20 DIAGNOSIS — E349 Endocrine disorder, unspecified: Secondary | ICD-10-CM | POA: Diagnosis not present

## 2023-09-20 DIAGNOSIS — Z87442 Personal history of urinary calculi: Secondary | ICD-10-CM | POA: Diagnosis not present

## 2023-11-01 DIAGNOSIS — E291 Testicular hypofunction: Secondary | ICD-10-CM | POA: Diagnosis not present

## 2023-11-01 DIAGNOSIS — N521 Erectile dysfunction due to diseases classified elsewhere: Secondary | ICD-10-CM | POA: Diagnosis not present

## 2023-11-01 DIAGNOSIS — H6121 Impacted cerumen, right ear: Secondary | ICD-10-CM | POA: Diagnosis not present
